# Patient Record
Sex: Female | Born: 1995 | Race: Black or African American | Hispanic: No | Marital: Single | Smoking: Former smoker
Health system: Southern US, Community
[De-identification: ages and names within clinical notes are randomized; demographics above are authoritative.]

## PROBLEM LIST (undated history)

## (undated) DIAGNOSIS — Z8679 Personal history of other diseases of the circulatory system: Secondary | ICD-10-CM

## (undated) DIAGNOSIS — F32A Depression, unspecified: Secondary | ICD-10-CM

## (undated) DIAGNOSIS — F329 Major depressive disorder, single episode, unspecified: Secondary | ICD-10-CM

## (undated) DIAGNOSIS — D649 Anemia, unspecified: Secondary | ICD-10-CM

## (undated) DIAGNOSIS — F419 Anxiety disorder, unspecified: Secondary | ICD-10-CM

## (undated) DIAGNOSIS — F410 Panic disorder [episodic paroxysmal anxiety] without agoraphobia: Secondary | ICD-10-CM

## (undated) DIAGNOSIS — G8929 Other chronic pain: Secondary | ICD-10-CM

## (undated) DIAGNOSIS — R0789 Other chest pain: Secondary | ICD-10-CM

## (undated) HISTORY — PX: CARDIAC ELECTROPHYSIOLOGY STUDY AND ABLATION: SHX1294

---
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
## 2014-08-15 ENCOUNTER — Emergency Department (HOSPITAL_COMMUNITY): Payer: 59

## 2014-08-15 ENCOUNTER — Encounter (HOSPITAL_COMMUNITY): Payer: Self-pay | Admitting: *Deleted

## 2014-08-15 ENCOUNTER — Emergency Department (HOSPITAL_COMMUNITY)
Admission: EM | Admit: 2014-08-15 | Discharge: 2014-08-15 | Disposition: A | Discharge status: Other Acute Inpatient Hospital | Payer: 59 | Attending: Emergency Medicine | Admitting: Emergency Medicine

## 2014-08-15 ENCOUNTER — Encounter (HOSPITAL_COMMUNITY): Payer: Self-pay

## 2014-08-15 ENCOUNTER — Inpatient Hospital Stay (HOSPITAL_COMMUNITY)
Admission: AD | Admit: 2014-08-15 | Discharge: 2014-08-17 | DRG: 885 | Disposition: A | Discharge status: Home / Self Care | Payer: 59 | Source: Intra-hospital | Attending: Psychiatry | Admitting: Psychiatry

## 2014-08-15 DIAGNOSIS — R079 Chest pain, unspecified: Secondary | ICD-10-CM | POA: Diagnosis present

## 2014-08-15 DIAGNOSIS — Z8679 Personal history of other diseases of the circulatory system: Secondary | ICD-10-CM | POA: Diagnosis not present

## 2014-08-15 DIAGNOSIS — I456 Pre-excitation syndrome: Secondary | ICD-10-CM | POA: Diagnosis present

## 2014-08-15 DIAGNOSIS — G8929 Other chronic pain: Secondary | ICD-10-CM | POA: Insufficient documentation

## 2014-08-15 DIAGNOSIS — Z3202 Encounter for pregnancy test, result negative: Secondary | ICD-10-CM | POA: Insufficient documentation

## 2014-08-15 DIAGNOSIS — R000 Tachycardia, unspecified: Secondary | ICD-10-CM | POA: Diagnosis not present

## 2014-08-15 DIAGNOSIS — F332 Major depressive disorder, recurrent severe without psychotic features: Secondary | ICD-10-CM | POA: Diagnosis present

## 2014-08-15 DIAGNOSIS — Z720 Tobacco use: Secondary | ICD-10-CM | POA: Diagnosis not present

## 2014-08-15 DIAGNOSIS — N390 Urinary tract infection, site not specified: Secondary | ICD-10-CM | POA: Diagnosis present

## 2014-08-15 DIAGNOSIS — Z793 Long term (current) use of hormonal contraceptives: Secondary | ICD-10-CM | POA: Insufficient documentation

## 2014-08-15 DIAGNOSIS — F17210 Nicotine dependence, cigarettes, uncomplicated: Secondary | ICD-10-CM | POA: Diagnosis present

## 2014-08-15 DIAGNOSIS — F410 Panic disorder [episodic paroxysmal anxiety] without agoraphobia: Secondary | ICD-10-CM | POA: Diagnosis present

## 2014-08-15 DIAGNOSIS — Z91040 Latex allergy status: Secondary | ICD-10-CM | POA: Insufficient documentation

## 2014-08-15 DIAGNOSIS — Z79899 Other long term (current) drug therapy: Secondary | ICD-10-CM | POA: Diagnosis not present

## 2014-08-15 DIAGNOSIS — Z792 Long term (current) use of antibiotics: Secondary | ICD-10-CM | POA: Diagnosis not present

## 2014-08-15 DIAGNOSIS — F419 Anxiety disorder, unspecified: Secondary | ICD-10-CM

## 2014-08-15 HISTORY — DX: Panic disorder (episodic paroxysmal anxiety): F41.0

## 2014-08-15 HISTORY — DX: Other chronic pain: G89.29

## 2014-08-15 HISTORY — DX: Other chest pain: R07.89

## 2014-08-15 HISTORY — DX: Personal history of other diseases of the circulatory system: Z86.79

## 2014-08-15 HISTORY — DX: Anxiety disorder, unspecified: F41.9

## 2014-08-15 LAB — URINE MICROSCOPIC-ADD ON

## 2014-08-15 LAB — URINALYSIS, ROUTINE W REFLEX MICROSCOPIC
Glucose, UA: NEGATIVE mg/dL
Ketones, ur: >80 mg/dL — AB
Nitrite: NEGATIVE
Protein, ur: NEGATIVE mg/dL
Specific Gravity, Urine: 1.024 (ref 1.005–1.030)
Urobilinogen, UA: 1 mg/dL (ref 0.0–1.0)
pH: 5.5 (ref 5.0–8.0)

## 2014-08-15 LAB — CBC WITH DIFFERENTIAL/PLATELET
Basophils Absolute: 0 10*3/uL (ref 0.0–0.1)
Basophils Relative: 0 % (ref 0–1)
Eosinophils Absolute: 0.1 10*3/uL (ref 0.0–0.7)
Eosinophils Relative: 1 % (ref 0–5)
HCT: 41.3 % (ref 36.0–46.0)
Hemoglobin: 14.4 g/dL (ref 12.0–15.0)
Lymphocytes Relative: 23 % (ref 12–46)
Lymphs Abs: 2 10*3/uL (ref 0.7–4.0)
MCH: 31.4 pg (ref 26.0–34.0)
MCHC: 34.9 g/dL (ref 30.0–36.0)
MCV: 90.2 fL (ref 78.0–100.0)
Monocytes Absolute: 0.4 10*3/uL (ref 0.1–1.0)
Monocytes Relative: 5 % (ref 3–12)
Neutro Abs: 6 10*3/uL (ref 1.7–7.7)
Neutrophils Relative %: 71 % (ref 43–77)
Platelets: 338 10*3/uL (ref 150–400)
RBC: 4.58 MIL/uL (ref 3.87–5.11)
RDW: 13 % (ref 11.5–15.5)
WBC: 8.4 10*3/uL (ref 4.0–10.5)

## 2014-08-15 LAB — I-STAT CHEM 8, ED
BUN: 7 mg/dL (ref 6–23)
Calcium, Ion: 1.22 mmol/L (ref 1.12–1.23)
Chloride: 105 mmol/L (ref 96–112)
Creatinine, Ser: 0.7 mg/dL (ref 0.50–1.10)
Glucose, Bld: 98 mg/dL (ref 70–99)
HCT: 39 % (ref 36.0–46.0)
Hemoglobin: 13.3 g/dL (ref 12.0–15.0)
Potassium: 3.8 mmol/L (ref 3.5–5.1)
Sodium: 140 mmol/L (ref 135–145)
TCO2: 19 mmol/L (ref 0–100)

## 2014-08-15 LAB — BASIC METABOLIC PANEL
Anion gap: 11 (ref 5–15)
BUN: 7 mg/dL (ref 6–23)
CO2: 21 mmol/L (ref 19–32)
Calcium: 10 mg/dL (ref 8.4–10.5)
Chloride: 106 mmol/L (ref 96–112)
Creatinine, Ser: 0.96 mg/dL (ref 0.50–1.10)
GFR calc Af Amer: >90 mL/min (ref >90)
GFR calc non Af Amer: 85 mL/min — ABNORMAL LOW (ref >90)
Glucose, Bld: 81 mg/dL (ref 70–99)
Potassium: 3.8 mmol/L (ref 3.5–5.1)
Sodium: 138 mmol/L (ref 135–145)

## 2014-08-15 LAB — TROPONIN I: Troponin I: <0.03 ng/mL (ref <0.031)

## 2014-08-15 LAB — PREGNANCY, URINE: Preg Test, Ur: NEGATIVE

## 2014-08-15 LAB — MAGNESIUM: Magnesium: 2.3 mg/dL (ref 1.5–2.5)

## 2014-08-15 LAB — I-STAT TROPONIN, ED: Troponin i, poc: 0 ng/mL (ref 0.00–0.08)

## 2014-08-15 MED ORDER — METRONIDAZOLE 500 MG PO TABS
500.0000 mg | ORAL_TABLET | Freq: Two times a day (BID) | ORAL | Status: DC
  Filled 2014-08-15: qty 1

## 2014-08-15 MED ORDER — HYDROXYZINE HCL 25 MG PO TABS
25.0000 mg | ORAL_TABLET | Freq: Four times a day (QID) | ORAL | Status: DC | PRN
  Administered 2014-08-16: 25 mg via ORAL
  Filled 2014-08-15: qty 8
  Filled 2014-08-15: qty 1

## 2014-08-15 MED ORDER — LORAZEPAM 0.5 MG PO TABS
0.5000 mg | ORAL_TABLET | Freq: Once | ORAL | Status: AC
  Administered 2014-08-15: 0.5 mg via ORAL
  Filled 2014-08-15: qty 1

## 2014-08-15 MED ORDER — ALUM & MAG HYDROXIDE-SIMETH 200-200-20 MG/5ML PO SUSP: 30.0000 mL | Freq: Four times a day (QID) | ORAL | Status: DC | PRN

## 2014-08-15 MED ORDER — IBUPROFEN 600 MG PO TABS
600.0000 mg | ORAL_TABLET | Freq: Four times a day (QID) | ORAL | Status: DC | PRN
  Administered 2014-08-16 – 2014-08-17 (×2): 600 mg via ORAL
  Filled 2014-08-15 (×2): qty 1

## 2014-08-15 MED ORDER — CEPHALEXIN 500 MG PO CAPS
500.0000 mg | ORAL_CAPSULE | Freq: Four times a day (QID) | ORAL | Status: DC
  Administered 2014-08-15 (×2): 500 mg via ORAL
  Filled 2014-08-15 (×2): qty 1

## 2014-08-15 MED ORDER — TRAZODONE HCL 50 MG PO TABS
50.0000 mg | ORAL_TABLET | Freq: Every evening | ORAL | Status: DC | PRN
  Administered 2014-08-16 (×2): 50 mg via ORAL
  Filled 2014-08-15 (×2): qty 1
  Filled 2014-08-15: qty 8
  Filled 2014-08-15 (×2): qty 1
  Filled 2014-08-15: qty 8
  Filled 2014-08-15 (×2): qty 1

## 2014-08-15 NOTE — ED Notes (Signed)
Pt denying suicidal ideations at this time.

## 2014-08-15 NOTE — BHH Counselor (Signed)
[facility] by Berneice Heinrichina Tate, AC. Notified [facility]N at [employer] [facility].   Kateri PlummerKristin Linsey Hirota, M.S., LPCA, [facility] Licensed Professional Counselor Associate  Triage Specialist  [facility]  Therapeutic Triage Services Phone: [phone] Fax: [phone]

## 2014-08-15 NOTE — BH Assessment (Signed)
TTS Belenda CruiseKristin assessed the patient.

## 2014-08-15 NOTE — BH Assessment (Signed)
BHH Assessment Progress Note  Per Alyssa Headonrad Withrow, NP, pt requires psychiatric hospitalization at this time. Alyssa Heinrichina Tate, RN, Alyssa Munoz has assigned pt to Rm 507-2, but [facility] until after 20:00. Pt has reluctantly signed Voluntary Admission and Consent for Treatment, as well as Consent to Release Information. Signed documents [facility]. Pt's nurse has been notified. Alyssa Munoz to send original documents to [facility] along with pt via Alyssa Munoz, and to call report to [phone].  Alyssa Canninghomas Kristina Bertone, MA Triage Specialist 08/15/2014 @ 16:55

## 2014-08-15 NOTE — ED Notes (Signed)
Patient is in burgundy scrubs and yellow socks.  Patient mother took her cell phone and personal belongings.  Patient has been wanded by security.

## 2014-08-15 NOTE — ED Notes (Signed)
Pt c/o central/left chest aching and palpitations and SOB starting this morning.  Pain score 7/10.   Pt reports intermittently having same chest pain and told by Mayo AoFlemming MD, with Cardiology, that they could not find anything wrong.  Pt's mother reports that typically she does not have SOB w/ pain.    Hx of ablation in 2011.

## 2014-08-15 NOTE — ED Notes (Signed)
Called lab to see about the wait on the UA, they stated they were running behind on their labs and to give it another 5-10 minutes.
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
## 2014-08-15 NOTE — Progress Notes (Signed)
Pt transferred via [facility]. -SI/HI, -A/[place] Denies pain or physical discomfort. Pt calm and cooperative.

## 2014-08-15 NOTE — Progress Notes (Signed)
Pt alert, anxious but cooperative. Mother at bedside. Denies SI/HI @ present, verbally contracts for safety. -A/[place]. VSS, Denies pain or physical discomfort. [facility]. [facility] notified to transport.

## 2014-08-15 NOTE — ED Notes (Signed)
[facility]. Pt aware of POC. [facility]

## 2014-08-15 NOTE — ED Notes (Signed)
TTS at bedside to discuss poc. Pt sign consent to discuss care with mother.

## 2014-08-15 NOTE — ED Provider Notes (Signed)
CSN: [id number]     Arrival date & time 08/15/14  1140 History   First MD Initiated Contact with Patient 08/15/14 1221     Chief Complaint  Patient presents with  . Chest Pain  . Shortness of Breath      HPI Pt was seen at 1220. Per pt and her mother, c/o gradual onset and persistence of constant anxiety for the past 2 days. Pt's symptoms include: palpitations, SOB, and acute flair of her chronic chest "pain." Pt's mother states she has hx of similar symptoms, was evaluated by her Cards MD and was told "they couldn't find anything wrong." Pt's mother states pt has been "under a great deal of stress" for the past 2 days (ie: school, boyfriend), and has been "unable to sleep because she's so stressed." Denies SI, no SA, no HI, no abd pain, no N/V/D, no cough, no fevers, no back pain.     Past Medical History  Diagnosis Date  . Anxiety   . Panic attack   . History of Wolff-Parkinson-White (WPW) syndrome     s/p ablation 2011 [facility] (Peds Cards Dr. Cristy FolksGregory Fleming)  . Chronic chest wall pain 2013   Past Surgical History  Procedure Laterality Date  . Cardiac electrophysiology study and ablation      wolffe parkinson syndrome    History  Substance Use Topics  . Smoking status: Current Every Day Smoker -- 0.25 packs/day    Types: Cigarettes  . Smokeless tobacco: Not on file  . Alcohol Use: No    Review of Systems ROS: Statement: All systems negative except as marked or noted in the HPI; Constitutional: Negative for fever and chills. ; ; Eyes: Negative for eye pain, redness and discharge. ; ; ENMT: Negative for ear pain, hoarseness, nasal congestion, sinus pressure and sore throat. ; ; Cardiovascular: Negative for diaphoresis, and peripheral edema. ; ; Respiratory: Negative for cough, wheezing and stridor. ; ; Gastrointestinal: Negative for nausea, vomiting, diarrhea, abdominal pain, blood in stool, hematemesis, jaundice and rectal bleeding. . ; ; Genitourinary: Negative for dysuria, flank  pain and hematuria. ; ; Musculoskeletal: Negative for back pain and neck pain. Negative for swelling and trauma.; ; Skin: Negative for pruritus, rash, abrasions, blisters, bruising and skin lesion.; ; Neuro: Negative for headache, lightheadedness and neck stiffness. Negative for weakness, altered level of consciousness , altered mental status, extremity weakness, paresthesias, involuntary movement, seizure and syncope.; Psych:  +anxiety: palpitations, SOB, chest pain. No SI, no SA, no HI, no hallucinations.     Allergies  Latex  Home Medications   Prior to Admission medications   Medication Sig Start Date End Date Taking? Authorizing Provider  aspirin EC 81 MG tablet Take 81 mg by mouth daily.   Yes Historical Provider, MD  LORazepam (ATIVAN) 1 MG tablet Take 1 tablet (1 mg total) by mouth 2 (two) times daily. Patient not taking: Reported on 08/15/2014 07/24/13   Izola PriceFrances C. Sanford, PA-C  Norethin Ace-Eth Estrad-FE (MINASTRIN 24 FE PO) Take 1 tablet by mouth daily.   Yes Historical Provider, MD  sulfamethoxazole-trimethoprim (BACTRIM DS) 800-160 MG per tablet Take 1 tablet by mouth 2 (two) times daily. Patient not taking: Reported on 08/15/2014 09/25/13   Peyton Najjaravid H Hopper, MD   BP 124/76 mmHg  Pulse 68  Temp(Src) 98 F (36.7 C) (Oral)  Resp 18  SpO2 98%  LMP 08/01/2014 (Approximate) Physical Exam  1225: Physical examination:  Nursing notes reviewed; Vital signs and O2 SAT reviewed;  Constitutional: Well  developed, Well nourished, Well hydrated, Crying and moaning.; Head:  Normocephalic, atraumatic; Eyes: EOMI, PERRL, No scleral icterus; ENMT: Mouth and pharynx normal, Mucous membranes moist; Neck: Supple, Full range of motion, No lymphadenopathy; Cardiovascular: Tachycardic rate and rhythm, No gallop; Respiratory: Breath sounds clear & equal bilaterally, No wheezes.  Speaking sentences, Hyperventilating.; Chest: Nontender, Movement normal; Abdomen: Soft, Nontender, Nondistended, Normal bowel  sounds; Genitourinary: No CVA tenderness; Extremities: Pulses normal, No tenderness, No edema, No calf edema or asymmetry.; Neuro: AA&Ox3, Major CN grossly intact.  Speech clear. No gross focal motor or sensory deficits in extremities.; Skin: Color normal, Warm, Dry.; Psych:  Crying, moaning, hyperventilating, anxious.    ED Course  Procedures   1230:  Pt hyperventilating on arrival to ED, tachycardic, crying and moaning. Ativan 0.5mg  PO tab given while workup progresses. Pt's mother agreeable with this plan.  1410:  Initial PO ativan given with good effect: pt able to tol PO well without N/V, HR decreased to 60's/NSR on monitor. Pt's mother stepped out of pt's exam room. Pt was "worried" that her mother was not immediately available and began again to become agitated: crying, moaning, hyperventilating and becoming tachycardic. Will dose another PO ativan and consult TTS. Labs pending. Will also speak with pt's Cards MD at [facility] once they result.   1500:  +UTI; will tx with keflex. T/C to [facility] Dr. Isidor Holts (on call for Dr. Meredeth Ide), case discussed, including:  HPI, pertinent PM/SHx, VS/PE, dx testing, ED course and treatment:   pt's WPW accessory tract has been ablated and she has been cleared for sports/gym/etc, he agrees with ED workup and psych consult for panic attacks/anxiety.  TTS currently evaluating pt.  1545: After 2nd dose of PO ativan, pt is now calmer, cooperative, NAD, resps easy, HR 60's. Pt has been accepted to [facility].       EKG Interpretation   Date/Time:  Wednesday August 15 2014 11:46:51 EST Ventricular Rate:  104 PR Interval:  123 QRS Duration: 73 QT Interval:  318 QTC Calculation: 418 R Axis:   69 Text Interpretation:  Sinus tachycardia Right atrial enlargement Baseline  wander When compared with ECG of 07/23/2013 No significant change was found  Confirmed by [facility]  MD, Nicholos Johns [phone]) on 08/15/2014 1:23:36 PM      MDM   MDM Reviewed:  previous chart, nursing note and vitals Reviewed previous: labs and ECG Interpretation: labs, ECG and x-ray   Results for orders placed or performed during the hospital encounter of 08/15/14  Urinalysis, Routine w reflex microscopic  Result Value Ref Range   Color, Urine AMBER (A) YELLOW   APPearance CLOUDY (A) CLEAR   Specific Gravity, Urine 1.024 1.005 - 1.030   pH 5.5 5.0 - 8.0   Glucose, UA NEGATIVE NEGATIVE mg/dL   Hgb urine dipstick MODERATE (A) NEGATIVE   Bilirubin Urine MODERATE (A) NEGATIVE   Ketones, ur >80 (A) NEGATIVE mg/dL   Protein, ur NEGATIVE NEGATIVE mg/dL   Urobilinogen, UA 1.0 0.0 - 1.0 mg/dL   Nitrite NEGATIVE NEGATIVE   Leukocytes, UA MODERATE (A) NEGATIVE  Pregnancy, urine  Result Value Ref Range   Preg Test, Ur NEGATIVE NEGATIVE  Basic metabolic panel  Result Value Ref Range   Sodium 138 135 - 145 mmol/L   Potassium 3.8 3.5 - 5.1 mmol/L   Chloride 106 96 - 112 mmol/L   CO2 21 19 - 32 mmol/L   Glucose, Bld 81 70 - 99 mg/dL   BUN 7 6 - 23  mg/dL   Creatinine, Ser 1.61 0.50 - 1.10 mg/dL   Calcium 09.6 8.4 - 04.5 mg/dL   GFR calc non Af Amer 85 (L) >90 mL/min   GFR calc Af Amer >90 >90 mL/min   Anion gap 11 5 - 15  CBC with Differential  Result Value Ref Range   WBC 8.4 4.0 - 10.5 K/uL   RBC 4.58 3.87 - 5.11 MIL/uL   Hemoglobin 14.4 12.0 - 15.0 g/dL   HCT 40.9 81.1 - 91.4 %   MCV 90.2 78.0 - 100.0 fL   MCH 31.4 26.0 - 34.0 pg   MCHC 34.9 30.0 - 36.0 g/dL   RDW 78.2 95.6 - 21.3 %   Platelets 338 150 - 400 K/uL   Neutrophils Relative % 71 43 - 77 %   Neutro Abs 6.0 1.7 - 7.7 K/uL   Lymphocytes Relative 23 12 - 46 %   Lymphs Abs 2.0 0.7 - 4.0 K/uL   Monocytes Relative 5 3 - 12 %   Monocytes Absolute 0.4 0.1 - 1.0 K/uL   Eosinophils Relative 1 0 - 5 %   Eosinophils Absolute 0.1 0.0 - 0.7 K/uL   Basophils Relative 0 0 - 1 %   Basophils Absolute 0.0 0.0 - 0.1 K/uL  Troponin I  Result Value Ref Range   Troponin I <0.03 <0.031 ng/mL  Magnesium   Result Value Ref Range   Magnesium 2.3 1.5 - 2.5 mg/dL  Urine microscopic-add on  Result Value Ref Range   Squamous Epithelial / LPF FEW (A) RARE   WBC, UA 7-10 <3 WBC/hpf   RBC / HPF 3-6 <3 RBC/hpf   Bacteria, UA RARE RARE   Urine-Other MUCOUS PRESENT   I-stat Chem 8, ED  Result Value Ref Range   Sodium 140 135 - 145 mmol/L   Potassium 3.8 3.5 - 5.1 mmol/L   Chloride 105 96 - 112 mmol/L   BUN 7 6 - 23 mg/dL   Creatinine, Ser 0.86 0.50 - 1.10 mg/dL   Glucose, Bld 98 70 - 99 mg/dL   Calcium, Ion 5.78 4.69 - 1.23 mmol/L   TCO2 19 0 - 100 mmol/L   Hemoglobin 13.3 12.0 - 15.0 g/dL   HCT 62.9 52.8 - 41.3 %  I-stat troponin, ED  Result Value Ref Range   Troponin i, poc 0.00 0.00 - 0.08 ng/mL   Comment 3           Dg Chest Port 1 View 08/15/2014   CLINICAL DATA:  Acute chest pain.  EXAM: PORTABLE CHEST - 1 VIEW  COMPARISON:  July 24, 2013.  FINDINGS: The heart size and mediastinal contours are within normal limits. Both lungs are clear. No pneumothorax or pleural effusion is noted. The visualized skeletal structures are unremarkable.  IMPRESSION: No acute cardiopulmonary abnormality seen.   Electronically Signed   By: Roque Lias M.D.   On: 08/15/2014 14:06        Samuel Jester, DO 08/18/14 0145

## 2014-08-16 DIAGNOSIS — F332 Major depressive disorder, recurrent severe without psychotic features: Principal | ICD-10-CM

## 2014-08-16 MED ORDER — NORETHIN ACE-ETH ESTRAD-FE 1-20 MG-MCG(24) PO CHEW
1.0000 | CHEWABLE_TABLET | Freq: Every day | ORAL | Status: DC
  Administered 2014-08-16: 1 via ORAL

## 2014-08-16 MED ORDER — NITROFURANTOIN MONOHYD MACRO 100 MG PO CAPS
100.0000 mg | ORAL_CAPSULE | Freq: Two times a day (BID) | ORAL | Status: DC
Start: 2014-08-16 — End: 2014-08-17
  Administered 2014-08-16 – 2014-08-17 (×2): 100 mg via ORAL
  Filled 2014-08-16 (×5): qty 1
  Filled 2014-08-16 (×2): qty 8
  Filled 2014-08-16: qty 1

## 2014-08-16 MED ORDER — FLUOXETINE HCL 10 MG PO CAPS: 10.0000 mg | ORAL_CAPSULE | Freq: Every day | ORAL | Status: DC

## 2014-08-16 MED ORDER — HYDROCERIN EX CREA
TOPICAL_CREAM | Freq: Two times a day (BID) | CUTANEOUS | Status: DC
  Administered 2014-08-16 – 2014-08-17 (×2): via TOPICAL
  Filled 2014-08-16: qty 113

## 2014-08-16 MED ORDER — FLUOXETINE HCL 10 MG PO CAPS
10.0000 mg | ORAL_CAPSULE | Freq: Every day | ORAL | Status: DC
  Filled 2014-08-16: qty 1
  Filled 2014-08-16: qty 4

## 2014-08-16 NOTE — Progress Notes (Signed)
Adult Psychoeducational Group Note  Date:  08/16/2014 Time:  9:31 PM  Group Topic/Focus:  Wrap-Up Group:   The focus of this group is to help patients review their daily goal of treatment and discuss progress on daily workbooks.  Participation Level:  Active  Participation Quality:  Appropriate  Affect:  Appropriate  Cognitive:  Appropriate  Insight: Appropriate  Engagement in Group:  Engaged  Modes of Intervention:  Discussion  Additional Comments:  Pt goal was to learn how to cope with her heartbreak. Pt is currently working on this goal while in the hospital.  Casilda CarlsKELLY, Adalay Azucena H 08/16/2014, 9:31 PM

## 2014-08-16 NOTE — Plan of Care (Signed)
Problem: Alteration in mood Goal: LTG-Patient reports reduction in suicidal thoughts (Patient reports reduction in suicidal thoughts and is able to verbalize a safety plan for whenever patient is feeling suicidal)  Outcome: Progressing Pt denied SI this shift.  She verbally contracted for safety.       

## 2014-08-16 NOTE — [facility] Suicide Risk Assessment (Signed)
[facility] INPATIENT:  Family/Significant Other Suicide Prevention Education  Suicide Prevention Education:  Education Completed; Alyssa Munoz (pt's mother) [phone] has been identified by Wilmon Armsthe patient as the family member/significant other with whom the patient will be residing, and identified as the person(s) who will aid the patient in the event of a mental health crisis (suicidal ideations/suicide attempt).  With written consent from the patient, the family member/significant other has been provided the following suicide prevention education, prior to the and/or following the discharge of the patient.  The suicide prevention education provided includes the following:  Suicide risk factors  Suicide prevention and interventions  National Suicide Hotline telephone number  [facility] assessment telephone number  [facility] Emergency Assistance 911  [facility] and/or Residential Mobile Crisis Unit telephone number  Request made of family/significant other to:  Remove weapons (e.g., guns, rifles, knives), all items previously/currently identified as safety concern.    Remove drugs/medications (over-the-counter, prescriptions, illicit drugs), all items previously/currently identified as a safety concern.  The family member/significant other verbalizes understanding of the suicide prevention education information provided.  The family member/significant other agrees to remove the items of safety concern listed above.  Smart, Kore Madlock LCSWA  08/16/2014, 11:25 AM

## 2014-08-16 NOTE — H&P (Addendum)
Psychiatric Admission Assessment Adult  Patient Identification: Alyssa Munoz MRN:  [id number] Date of Evaluation:  08/16/2014 Chief Complaint:  MDD SINGLE EPISODE Principal Diagnosis: MDD (major depressive disorder), recurrent severe, without psychosis Diagnosis:   Patient Active Problem List   Diagnosis Date Noted  . MDD (major depressive disorder), recurrent severe, without psychosis [F33.2] 08/15/2014   History of Present Illness: Alyssa Munoz is a 19 yo female patient who came in with suicidal attempt.  Patient was tearful on admission interview today.  She states that she is young, impulsive and just did not think.  Her boyfriend broke up with her and she attempted to overdose on Tylenol.  She states that she threw them up right away.  She saw that her mother was quite upset and this made patient think about her actions.  She is minimizing her depression and the event that tok place.  She is asking to be discharged that she has school that she will miss and that she will be ok.  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  She has a history of Wolff-Parkinson-White Syndrome.  She has had surgery for this in the past.      Elements:  Location:  Depression, suicidal attempt.. Quality:  feeling hopeless, suicidal. Severity:  Severe. Timing:  After break up with boyfriend. Duration:  Acute. Context:  Patient states that she had never had any episodes before.  She was impulsive when took OD of Tylenol after boyfriend broke up with her". Associated Signs/Symptoms: Depression Symptoms:  depressed mood, feelings of worthlessness/guilt, hopelessness, suicidal attempt, anxiety, (Hypo) Manic Symptoms:  Labiality of Mood, Anxiety Symptoms:  Excessive Worry, Psychotic Symptoms:  NA PTSD Symptoms: Break up with BF Total Time spent with patient: 30 minutes  Past Medical History:  Past Medical History  Diagnosis Date  . Anxiety   .  Panic attack   . History of Wolff-Parkinson-White (WPW) syndrome     s/p ablation 2011 [facility] (Peds Cards Dr. Kathie Rhodes)  . Chronic chest wall pain 2013    Past Surgical History  Procedure Laterality Date  . Cardiac electrophysiology study and ablation      wolffe parkinson syndrome   Family History: History reviewed. No pertinent family history. Social History:  History  Alcohol Use No     History  Drug Use No    History   Social History  . Marital Status: Single    Spouse Name: N/A    Number of Children: N/A  . Years of Education: N/A   Social History Main Topics  . Smoking status: Current Every Day Smoker -- 0.25 packs/day    Types: Cigarettes  . Smokeless tobacco: None  . Alcohol Use: No  . Drug Use: No  . Sexual Activity: Yes    Birth Control/ Protection: Pill   Other Topics Concern  . None   Social History Narrative   ** Merged History Encounter **       Additional Social History:      1 - Frequency: occasional  Musculoskeletal: Strength & Muscle Tone: within normal limits Gait & Station: normal Patient leans: N/A  Psychiatric Specialty Exam: Physical Exam  Vitals reviewed.   Review of Systems  Constitutional: Negative.   HENT: Negative.   Eyes: Negative.   Respiratory: Negative.   Cardiovascular: Negative.   Gastrointestinal: Negative.   Genitourinary: Negative.   Musculoskeletal: Negative.   Skin: Negative.   Neurological: Negative.  Endo/Heme/Allergies: Negative.   Psychiatric/Behavioral: Positive for depression. The patient is nervous/anxious.     Blood pressure 112/83, pulse 103, temperature 98.3 F (36.8 C), temperature source Oral, resp. rate 16, height _0  (1.6 m), weight 47.174 kg (104 lb), last menstrual period 08/01/2014.Body mass index is 18.43 kg/(m^2).  General Appearance: Fairly Groomed  Engineer, water::  Good  Speech:  Clear and Coherent  Volume:  Normal  Mood:  Depressed  Affect:  Appropriate  Thought Process:   Intact  Orientation:  Full (Time, Place, and Person)  Thought Content:  Rumination  Suicidal Thoughts:  No  Homicidal Thoughts:  No  Memory:  Immediate;   Fair Recent;   Fair Remote;   Fair  Judgement:  Fair  Insight:  Fair  Psychomotor Activity:  Normal  Concentration:  Good  Recall:  Good  Fund of Knowledge:Good  Language: Good  Akathisia:  Negative  Handed:  Right  AIMS (if indicated):     Assets:  Desire for Improvement Resilience Social Support  ADL's:  Intact  Cognition: WNL  Sleep:  Number of Hours: 2   Risk to Self: Is patient at risk for suicide?: Yes What has been your use of drugs/alcohol within the last 12 months?: none identified by pt.  Risk to Others:   Prior Inpatient Therapy:   Prior Outpatient Therapy:    Alcohol Screening: 1. How often do you have a drink containing alcohol?: 2 to 4 times a month 2. How many drinks containing alcohol do you have on a typical day when you are drinking?: 1 or 2 3. How often do you have six or more drinks on one occasion?: Never Preliminary Score: 0 9. Have you or someone else been injured as a result of your drinking?: No 10. Has a relative or friend or a doctor or another health worker been concerned about your drinking or suggested you cut down?: No Alcohol Use Disorder Identification Test Final Score (AUDIT): 2  Allergies:   Allergies  Allergen Reactions  . Latex Hives and Itching   Lab Results:  Results for orders placed or performed during the hospital encounter of 08/15/14 (from the past 48 hour(s))  Basic metabolic panel     Status: Abnormal   Collection Time: 08/15/14 12:31 PM  Result Value Ref Range   Sodium 138 135 - 145 mmol/L   Potassium 3.8 3.5 - 5.1 mmol/L   Chloride 106 96 - 112 mmol/L   CO2 21 19 - 32 mmol/L   Glucose, Bld 81 70 - 99 mg/dL   BUN 7 6 - 23 mg/dL   Creatinine, Ser 0.96 0.50 - 1.10 mg/dL   Calcium 10.0 8.4 - 10.5 mg/dL   GFR calc non Af Amer 85 (L) >90 mL/min   GFR calc Af Amer  >90 >90 mL/min    Comment: (NOTE) The eGFR has been calculated using the CKD EPI equation. This calculation has not been validated in all clinical situations. eGFR's persistently <90 mL/min signify possible Chronic Kidney Disease.    Anion gap 11 5 - 15    Comment: Performed at [facility]  CBC with Differential     Status: None   Collection Time: 08/15/14 12:31 PM  Result Value Ref Range   WBC 8.4 4.0 - 10.5 K/uL   RBC 4.58 3.87 - 5.11 MIL/uL   Hemoglobin 14.4 12.0 - 15.0 g/dL   HCT 41.3 36.0 - 46.0 %   MCV 90.2 78.0 - 100.0 fL   MCH 31.4  26.0 - 34.0 pg   MCHC 34.9 30.0 - 36.0 g/dL   RDW 13.0 11.5 - 15.5 %   Platelets 338 150 - 400 K/uL   Neutrophils Relative % 71 43 - 77 %   Neutro Abs 6.0 1.7 - 7.7 K/uL   Lymphocytes Relative 23 12 - 46 %   Lymphs Abs 2.0 0.7 - 4.0 K/uL   Monocytes Relative 5 3 - 12 %   Monocytes Absolute 0.4 0.1 - 1.0 K/uL   Eosinophils Relative 1 0 - 5 %   Eosinophils Absolute 0.1 0.0 - 0.7 K/uL   Basophils Relative 0 0 - 1 %   Basophils Absolute 0.0 0.0 - 0.1 K/uL  Troponin I     Status: None   Collection Time: 08/15/14 12:31 PM  Result Value Ref Range   Troponin I <0.03 <0.031 ng/mL    Comment:        NO INDICATION OF MYOCARDIAL INJURY. Performed at [facility]   Magnesium     Status: None   Collection Time: 08/15/14 12:31 PM  Result Value Ref Range   Magnesium 2.3 1.5 - 2.5 mg/dL    Comment: Performed at [facility]  Urinalysis, Routine w reflex microscopic     Status: Abnormal   Collection Time: 08/15/14  1:15 PM  Result Value Ref Range   Color, Urine AMBER (A) YELLOW    Comment: BIOCHEMICALS MAY BE AFFECTED BY COLOR   APPearance CLOUDY (A) CLEAR   Specific Gravity, Urine 1.024 1.005 - 1.030   pH 5.5 5.0 - 8.0   Glucose, UA NEGATIVE NEGATIVE mg/dL   Hgb urine dipstick MODERATE (A) NEGATIVE   Bilirubin Urine MODERATE (A) NEGATIVE   Ketones, ur >80 (A) NEGATIVE mg/dL   Protein, ur NEGATIVE NEGATIVE mg/dL    Urobilinogen, UA 1.0 0.0 - 1.0 mg/dL   Nitrite NEGATIVE NEGATIVE   Leukocytes, UA MODERATE (A) NEGATIVE  Pregnancy, urine     Status: None   Collection Time: 08/15/14  1:15 PM  Result Value Ref Range   Preg Test, Ur NEGATIVE NEGATIVE    Comment:        THE SENSITIVITY OF THIS METHODOLOGY IS >20 mIU/mL.   Urine microscopic-add on     Status: Abnormal   Collection Time: 08/15/14  1:15 PM  Result Value Ref Range   Squamous Epithelial / LPF FEW (A) RARE   WBC, UA 7-10 <3 WBC/hpf   RBC / HPF 3-6 <3 RBC/hpf   Bacteria, UA RARE RARE   Urine-Other MUCOUS PRESENT   I-stat Chem 8, ED     Status: None   Collection Time: 08/15/14  2:24 PM  Result Value Ref Range   Sodium 140 135 - 145 mmol/L   Potassium 3.8 3.5 - 5.1 mmol/L   Chloride 105 96 - 112 mmol/L   BUN 7 6 - 23 mg/dL   Creatinine, Ser 0.70 0.50 - 1.10 mg/dL   Glucose, Bld 98 70 - 99 mg/dL   Calcium, Ion 1.22 1.12 - 1.23 mmol/L   TCO2 19 0 - 100 mmol/L   Hemoglobin 13.3 12.0 - 15.0 g/dL   HCT 39.0 36.0 - 46.0 %  I-stat troponin, ED     Status: None   Collection Time: 08/15/14  2:24 PM  Result Value Ref Range   Troponin i, poc 0.00 0.00 - 0.08 ng/mL   Comment 3            Comment: Due to the release kinetics of cTnI,  a negative result within the first hours of the onset of symptoms does not rule out myocardial infarction with certainty. If myocardial infarction is still suspected, repeat the test at appropriate intervals.    Current Medications: Current Facility-Administered Medications  Medication Dose Route Frequency Provider Last Rate Last Dose  . alum & mag hydroxide-simeth (MAALOX/MYLANTA) 200-200-20 MG/5ML suspension 30 mL  30 mL Oral Q6H PRN Laverle Hobby, PA-C      . hydrOXYzine (ATARAX/VISTARIL) tablet 25 mg  25 mg Oral Q6H PRN Laverle Hobby, PA-C      . ibuprofen (ADVIL,MOTRIN) tablet 600 mg  600 mg Oral Q6H PRN Laverle Hobby, PA-C      . traZODone (DESYREL) tablet 50 mg  50 mg Oral QHS,MR X 1 Spencer E  Simon, PA-C   50 mg at 08/16/14 4580   PTA Medications: No prescriptions prior to admission   Previous Psychotropic Medications: No   Substance Abuse History in the last 12 months:  No.  Consequences of Substance Abuse: NA  Results for orders placed or performed during the hospital encounter of 08/15/14 (from the past 72 hour(s))  Basic metabolic panel     Status: Abnormal   Collection Time: 08/15/14 12:31 PM  Result Value Ref Range   Sodium 138 135 - 145 mmol/L   Potassium 3.8 3.5 - 5.1 mmol/L   Chloride 106 96 - 112 mmol/L   CO2 21 19 - 32 mmol/L   Glucose, Bld 81 70 - 99 mg/dL   BUN 7 6 - 23 mg/dL   Creatinine, Ser 0.96 0.50 - 1.10 mg/dL   Calcium 10.0 8.4 - 10.5 mg/dL   GFR calc non Af Amer 85 (L) >90 mL/min   GFR calc Af Amer >90 >90 mL/min    Comment: (NOTE) The eGFR has been calculated using the CKD EPI equation. This calculation has not been validated in all clinical situations. eGFR's persistently <90 mL/min signify possible Chronic Kidney Disease.    Anion gap 11 5 - 15    Comment: Performed at [facility]  CBC with Differential     Status: None   Collection Time: 08/15/14 12:31 PM  Result Value Ref Range   WBC 8.4 4.0 - 10.5 K/uL   RBC 4.58 3.87 - 5.11 MIL/uL   Hemoglobin 14.4 12.0 - 15.0 g/dL   HCT 41.3 36.0 - 46.0 %   MCV 90.2 78.0 - 100.0 fL   MCH 31.4 26.0 - 34.0 pg   MCHC 34.9 30.0 - 36.0 g/dL   RDW 13.0 11.5 - 15.5 %   Platelets 338 150 - 400 K/uL   Neutrophils Relative % 71 43 - 77 %   Neutro Abs 6.0 1.7 - 7.7 K/uL   Lymphocytes Relative 23 12 - 46 %   Lymphs Abs 2.0 0.7 - 4.0 K/uL   Monocytes Relative 5 3 - 12 %   Monocytes Absolute 0.4 0.1 - 1.0 K/uL   Eosinophils Relative 1 0 - 5 %   Eosinophils Absolute 0.1 0.0 - 0.7 K/uL   Basophils Relative 0 0 - 1 %   Basophils Absolute 0.0 0.0 - 0.1 K/uL  Troponin I     Status: None   Collection Time: 08/15/14 12:31 PM  Result Value Ref Range   Troponin I <0.03 <0.031 ng/mL    Comment:         NO INDICATION OF MYOCARDIAL INJURY. Performed at [facility]   Magnesium     Status: None  Collection Time: 08/15/14 12:31 PM  Result Value Ref Range   Magnesium 2.3 1.5 - 2.5 mg/dL    Comment: Performed at [facility]  Urinalysis, Routine w reflex microscopic     Status: Abnormal   Collection Time: 08/15/14  1:15 PM  Result Value Ref Range   Color, Urine AMBER (A) YELLOW    Comment: BIOCHEMICALS MAY BE AFFECTED BY COLOR   APPearance CLOUDY (A) CLEAR   Specific Gravity, Urine 1.024 1.005 - 1.030   pH 5.5 5.0 - 8.0   Glucose, UA NEGATIVE NEGATIVE mg/dL   Hgb urine dipstick MODERATE (A) NEGATIVE   Bilirubin Urine MODERATE (A) NEGATIVE   Ketones, ur >80 (A) NEGATIVE mg/dL   Protein, ur NEGATIVE NEGATIVE mg/dL   Urobilinogen, UA 1.0 0.0 - 1.0 mg/dL   Nitrite NEGATIVE NEGATIVE   Leukocytes, UA MODERATE (A) NEGATIVE  Pregnancy, urine     Status: None   Collection Time: 08/15/14  1:15 PM  Result Value Ref Range   Preg Test, Ur NEGATIVE NEGATIVE    Comment:        THE SENSITIVITY OF THIS METHODOLOGY IS >20 mIU/mL.   Urine microscopic-add on     Status: Abnormal   Collection Time: 08/15/14  1:15 PM  Result Value Ref Range   Squamous Epithelial / LPF FEW (A) RARE   WBC, UA 7-10 <3 WBC/hpf   RBC / HPF 3-6 <3 RBC/hpf   Bacteria, UA RARE RARE   Urine-Other MUCOUS PRESENT   I-stat Chem 8, ED     Status: None   Collection Time: 08/15/14  2:24 PM  Result Value Ref Range   Sodium 140 135 - 145 mmol/L   Potassium 3.8 3.5 - 5.1 mmol/L   Chloride 105 96 - 112 mmol/L   BUN 7 6 - 23 mg/dL   Creatinine, Ser 0.70 0.50 - 1.10 mg/dL   Glucose, Bld 98 70 - 99 mg/dL   Calcium, Ion 1.22 1.12 - 1.23 mmol/L   TCO2 19 0 - 100 mmol/L   Hemoglobin 13.3 12.0 - 15.0 g/dL   HCT 39.0 36.0 - 46.0 %  I-stat troponin, ED     Status: None   Collection Time: 08/15/14  2:24 PM  Result Value Ref Range   Troponin i, poc 0.00 0.00 - 0.08 ng/mL   Comment 3            Comment: Due to  the release kinetics of cTnI, a negative result within the first hours of the onset of symptoms does not rule out myocardial infarction with certainty. If myocardial infarction is still suspected, repeat the test at appropriate intervals.     Observation Level/Precautions:  15 minute checks  Laboratory:  per ED  Psychotherapy:  Group therapy  Medications:  As per medlist  Consultations:  As needed  Discharge Concerns:  Safety  Estimated LOS:  5-7 days  Other:     Psychological Evaluations: Yes   Treatment Plan Summary: Daily contact with patient to assess and evaluate symptoms and progress in treatment and Medication management  -Started on Macrobid 100 mg BID for UTI -Started on Prozac 10 mg QD for depression -Started on Eucerin for dry spots  Medical Decision Making:  New problem, with additional work up planned, Review of Psycho-Social Stressors (1), Discuss test with performing physician (1), New Problem, with no additional work-up planned (3), Review of Medication Regimen & Side Effects (2) and Review of New Medication or Change in Dosage (2)  I certify  that inpatient services furnished can reasonably be expected to improve the patient's condition.   Kerrie Buffalo MAY, AGNP-BC 1/28/20161:49 PM   I have discussed case with NP as above and have met with patient. Agree with NP's Note, Assessment and Plan Patient is a 19 year old female, college student, denies any significant prior psychiatric history or recent depression. States she had a recent break up with her boyfriend, felt acutely upset , depressed and impulsively overdosed on several Tylenol tablets. She states she spontaneously vomited soon after. She does state she  Has been struggling with some mild depression, and states she feels she would normally be less reactive to stressors such as above. Expressed interest in an antidepressant, started on Prozac- side effects, to include potential risk of increased suicidal  ideations early in treatment in young adults reviewed.

## 2014-08-16 NOTE — Tx Team (Signed)
Interdisciplinary Treatment Plan Update (Adult)   Date: 08/16/2014   Time Reviewed: 8:51 AM  Progress in Treatment:  Attending groups: no-new to unit  Participating in groups:  No Taking medication as prescribed: Yes  Tolerating medication: Yes  Family/Significant othe contact made: SPE completed with pt's mother.   Patient understands diagnosis: Yes, AEB seeking treatment for SI with attempted overdose, depression/anxiety/panic/mood instability, impulsivity, and for medication stabilization.  Discussing patient identified problems/goals with staff: Yes  Medical problems stabilized or resolved: Yes  Denies suicidal/homicidal ideation: Passive SI/able to contract for safety.  Patient has not harmed self or Others: Yes  New problem(s) identified:  Discharge Plan or Barriers: Pt plans to return home at d/c and is requesting referral for med management and therapy. CSW assessing.  Additional comments: Alyssa Munoz is an 19 y.o. female who [facility] with complaints of chest pains, shortness of breath and panic attacks. She stated that she has had two panic attacks today and has a lot of stress with a recent break up, issues with her friends and school. She states that she tried to overdose yesterday on 6-7 Acetaminophen tablets and ended up throwing up. She states that this was an attempt to end her life but she did not tell anyone until the assessment today. She states that she "doesn't know" if she feels suicidal today but states that she gets impulsive sometimes. She says that the overdose had a lot to do with the break up but she has had other stressors as well. She states that she has never felt "this bad" before but has had some depression in the past. She says that she has felt depressed since 8 months ago when the initial break up happened. She states she does not like to feel alone. She also states she gets scared when she is alone and has a lot of anxiety when  she is by herself. Pt does not endorse HI or A/V hallucinations at this time. She does state that she smokes marijuana almost daily and smoked earlier today. She states that this helps with her anxiety and stress and helps her relax. No other substance abuse issues besides social drinking.  Reason for Continuation of Hospitalization: Panic attacks/anxiety/depression Medication stabilization Estimated length of stay: 5-7 days  For review of initial/current patient goals, please see plan of care.  Attendees:  Patient:    Family:    Physician: Geoffery LyonsIrving Lugo MD/Dr. Jama Flavorsobos MD 08/16/2014 8:47 AM   Nursing: Darcella GasmanBeverly, Diane RN 08/16/2014 8:47 AM   Clinical Social Worker Veryl Abril Smart, LCSWA  08/16/2014 8:47 AM   Other: Ellison CarwinQuylle LCSW; Kristin D. LCSWA 08/16/2014 8:47 AM   Other: Darden DatesJennifer C. Nurse CM 08/16/2014 8:47 AM   Other: Mercy RidingValerie, [facility] TCT  08/16/2014 8:47 AM   Other:    Scribe for Treatment Team:  The Sherwin-WilliamsHeather Smart LCSWA 08/16/2014 8:47 AM

## 2014-08-16 NOTE — Progress Notes (Signed)
NSG shift assessment. 7a-7p.   D: Affect blunted, mood depressed, behavior appropriate. Sleep last night was good and she did request medication for sleep.  Appetite has been good with a normal energy level. Concentration is good. On a scale of 0 to 10, with 10 being worst, pt rates depression as 0, hopelessness is 0 and anxiety is 0. Attends groups and participates. Goal is to remain humble. Cooperative with staff and is getting along well with peers.   A: Observed pt interacting in group and in the milieu: Support and encouragement offered. Safety maintained with observations every 15 minutes.   R:   Contracts for safety and continues to follow the treatment plan, working on learning new coping skills.

## 2014-08-16 NOTE — [facility] Group Notes (Signed)
[facility] LCSW Group Therapy  08/16/2014 2:20 PM  Type of Therapy:  Group Therapy  Participation Level:  None-pt pulled out of group by MD early in session. Did not return.   Participation Quality:  Attentive  Affect:  Appropriate  Cognitive:  Alert and Oriented  Insight:  Improving  Engagement in Therapy:  None-see above  Modes of Intervention:  Confrontation, Discussion, Education, Exploration, Problem-solving, Rapport Building, Socialization and Support  Summary of Progress/Problems:  Finding Balance in Life. Today's group focused on defining balance in one's own words, identifying things that can knock one off balance, and exploring healthy ways to maintain balance in life. Group members were asked to provide an example of a time when they felt off balance, describe how they handled that situation,and process healthier ways to regain balance in the future. Group members were asked to share the most important tool for maintaining balance that they learned [facility] and how they plan to apply this method after discharge.   Smart, Alyssa Munoz LCSWA  08/16/2014, 2:20 PM

## 2014-08-16 NOTE — Progress Notes (Signed)
D: Pt has depressed, sad affect and mood.  Met with pt 1:1.  Pt reported "I have issues coping with things.  There are multiple things going on in my life adding to my stress.  The guy I've been with for 3 years hurt and I feel broken.  I requested to take an antidepressant because I know I can't cope with this alone.  I've been replaying the things he said to me over and over."  Pt reported that she intentionally "took some pills" and then "threw them up."  Pt reports she had a panic attack, which is why she went to the ED.  Pt was tearful at times while talking to Probation officer.  Pt attended evening group and interacted appropriately with staff and peers.  Pt denies SI/HI, denies hallucinations.   A: Medications administered per order.  Safety maintained.  Met with pt 1:1 and provided support and encouragement.  Actively listened to pt.  PRN medication administered for anxiety, see flowsheet.   R: Pt is compliant with medications.  She verbally contracted for safety.  Pt reports she will notify staff of needs and concerns.  Will continue to monitor and assess for safety.

## 2014-08-16 NOTE — [facility] Group Notes (Signed)
0900 Nursing Group Note   The focus of this group is to educate the patient on the purpose and policies of crisis stabilization and provide a format to answer questions about their admission.  The group details unit policies and expectations of patients while admitted.  Pt was present, alert and appropriate during group.   

## 2014-08-16 NOTE — Progress Notes (Signed)
D)  [facility] and [facility], but voiced her anxiety over being here.  Was cooperative throughout the admission process, stated she has had a number of stressors that left her feeling overwhelmed and with thoughts of suicide and admitted then to taking 5 or 6 ibuprofen yesterday, and admitted that at the time she had thought about dying, and then made herself throw up.  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  Also stated she is close to her family, and requested to call her mother tonight.  When she made the call, she became tearful and wanted her mother to come tonight and get her, spoke with her mother and explained why she couldn't get her tonight but also explained visitation and program.  Was oriented to unit, rules, contracts for safety. Pt states has cardiac history of an ablation in 2011 for WPW syndrome, [facility]. A)  Will continue to monitor for safety, continue POC R)  Safety maintained

## 2014-08-16 NOTE — [facility] Counselor (Signed)
Adult Comprehensive Assessment  Patient ID: Alyssa Munoz, female   DOB: [birth year], 19 y.o.   MRN: [id number]  Information Source: Information source: Patient  Current Stressors:  Physical health (include injuries & life threatening diseases): none identified.  Bereavement / Loss: Broke up with boyfriend 8 months ago-we officially broke up Tuesday. We had been dating for 3 years.   Living/Environment/Situation:  Living Arrangements: Parent Living conditions (as described by patient or guardian): lives with mom, brother, and stepdad.  How long has patient lived in current situation?: We get along great. I've lived there for my whole life. My stepdad has been in my life since age 16 and took the roll of dad.  What is atmosphere in current home: Comfortable, Supportive, Loving  Family History:  Marital status: Single Does patient have children?: No  Childhood History:  By whom was/is the patient raised?: Mother/father and step-parent Additional childhood history information: mom and stepdad raised me. no relationship with biological dad.  Description of patient's relationship with caregiver when they were a child: great relationship with mom and stepdad.  Patient's description of current relationship with people who raised him/her: still very close to mom and stepdad. no relationship with bio father.  Does patient have siblings?: Yes Number of Siblings: 1 Description of patient's current relationship with siblings: good relationship with little brother who lives in the home.  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 I'm doing well in school.  Currently a student?: Yes If yes, how has current illness impacted academic performance: n/a  Name of school: [employer] Contact person: n/a  How long has the patient attended?: 1 year  Learning disability?: No  Employment/Work Situation:   Employment situation: Surveyor, minerals job has been impacted by current illness: No (n/a ) What is the longest time patient has a held a job?: no work history Where was the patient employed at that time?: no work history Has patient ever been in the [employer]?: No Has patient ever served in Buyer, retail?: No  Financial Resources:   Psychologist, prison and probation services, Support from parents / caregiver Does patient have a Lawyer or guardian?: No  Alcohol/Substance Abuse:   What has been your use of drugs/alcohol within the last 12 months?: none identified by pt.  If attempted suicide, did drugs/alcohol play a role in this?: Yes (I took an overdose of tylenol on Tuesday night. That was my only attempt. never had thoughts before) Alcohol/Substance Abuse Treatment Hx: Denies past history If yes, describe treatment: n/a  Has alcohol/substance abuse ever caused legal problems?: No  Social Support System:   Patient's Community Support System: Good Describe Community Support System: Mostly friends from school. Type of faith/religion: Christian-Baptist How does patient's faith help to cope with current illness?: prayer; church  Leisure/Recreation:   Leisure and Hobbies: ran track in high school. Doing hair.   Strengths/Needs:    Strengths: insightful/motivated to seek counseling outpatient and try medication if necessary. Needs: effective coping skills to manage stress/depression, med management, impulsivity/emotional regulation.   Discharge Plan:   Does patient have access to transportation?:  Yes (mom drives me) Will  patient be returning to same living situation after discharge?: Yes (returning home with mom) Currently receiving community mental health services: No If no, would patient like referral for services when discharged?: Yes (What county?) Medical sales representative([place]) Does patient have financial barriers related to discharge medications?: No (private insurance/support from mom and stepddad)  Summary/Recommendations:    Pt is 19 year old female living with her mother, brother, and stepfather in [place], [place]. [facility] to suicide attempt by overdose, depression, impulsivity, and for medication stabilization. Pt denies HI/AVH upon admission and denies SI currently. Pt denies S/A history and denies previous SI or attempts. Pt has no prior mental health history and does not have outpatient providers. Pt identifies triggers as: recent breakup with bf of three years, and stress of school-pt in beauty school currently. Recommendations for pt include: crisis stabilization, therapeutic milieu, encourage group attendance and participation, medication management for mood stabilization, and development of comprehensive mental wellness plan. Pt plans to return home with mom at d/c and is open to referral for med management and 1:1 therapy-CSW assessing for appropriate referrals.   Trula SladeSmart, Ohanna Gassert [facility]osp [facility] VistaCSWA 08/16/2014 11:32 AM

## 2014-08-17 MED ORDER — HYDROXYZINE HCL 25 MG PO TABS: 25.0000 mg | ORAL_TABLET | Freq: Four times a day (QID) | ORAL | Status: DC | PRN

## 2014-08-17 MED ORDER — TRAZODONE HCL 50 MG PO TABS: 50.0000 mg | ORAL_TABLET | Freq: Every evening | ORAL | Status: DC | PRN

## 2014-08-17 MED ORDER — NORETHIN ACE-ETH ESTRAD-FE 1-20 MG-MCG(24) PO CHEW: 1.0000 | CHEWABLE_TABLET | Freq: Every day | ORAL | Status: DC

## 2014-08-17 MED ORDER — FLUOXETINE HCL 10 MG PO CAPS: 10.0000 mg | ORAL_CAPSULE | Freq: Every day | ORAL | Status: DC

## 2014-08-17 MED ORDER — NITROFURANTOIN MONOHYD MACRO 100 MG PO CAPS: 100.0000 mg | ORAL_CAPSULE | Freq: Two times a day (BID) | ORAL | Status: DC

## 2014-08-17 NOTE — Progress Notes (Addendum)
  [facility] Discharge Plan :  Will you be returning to the same living situation after discharge:  Yes,  home with mother At discharge, do you have transportation home?: Yes,  mother coming after lunch to pick her up. Do you have the ability to pay for your medications: Yes,  Advertising copywriter[employer] (per Darden DatesJennifer C. Nurse CM)  Release of information consent forms completed and submitted to Medical Records by CSW.  Patient to Follow up at: Follow-up Information    [facility] with [facility] On 08/17/2014.   Why:  referrals sent 1/28. Call agency on Monday morning to check status of referral and get appt time/date.    Contact information:   [address] [place], [place] [address] Phone: [phone] Fax: [phone]      [facility] with [facility].   Why:  referrals sent 1/28. Call agency on Monday morning to check status of referral and get appt time/date.    Contact information:   [address] [place], [place] [address] Phone: [phone] Fax: [phone]    *Pt and her mother aware that they must call [facility] on Monday morning per request of the agency to verify she is accepted as pt and to get appt dates and times. They were also provided with additional resources for mental health follow-up if she is not accepted (in chart).   Patient denies SI/HI: Yes,  during group/self report.     Safety Planning and Suicide Prevention discussed: Yes,  SPE completed with pt's mother. SPI pamphlet provided to pt and she was encouraged to share information with support network, ask questions, and talk about any concerns relating to SPE.  Has patient been referred to the Quitline?: N/A patient is not a smoker   Smart, Lebron QuamHeather LCSWA  08/17/2014, 10:36 AM

## 2014-08-17 NOTE — Progress Notes (Signed)
Nursing discharge note:  Patient discharged home per MD order.  Patient denies SI/HI/AVH.  Patient received all personal belongings, prescriptions and medication samples.  Discharge instructions and medications reviewed and patient indicated understanding of same.  Follow up appts. Reviewed with patient.  Patient left ambulatory with her mother.

## 2014-08-17 NOTE — Discharge Summary (Signed)
Physician Discharge Summary Note  Patient:  Alyssa Munoz is an 19 y.o., female MRN:  [id number] DOB:  October 04, 1995 Patient phone:  [phone] (home)  Patient address:   [address] [place] [address],  Total Time spent with patient: 30 minutes  Date of Admission:  08/15/2014 Date of Discharge: 08/17/14  Reason for Admission:  Mood stabilization treatments  Principal Problem: MDD (major depressive disorder), recurrent severe, without psychosis Discharge Diagnoses: Patient Active Problem List   Diagnosis Date Noted  . MDD (major depressive disorder), recurrent severe, without psychosis [F33.2] 08/15/2014   Musculoskeletal: Strength & Muscle Tone: within normal limits Gait & Station: normal Patient leans: N/A  Psychiatric Specialty Exam: Physical Exam  Psychiatric: She has a normal mood and affect. Her speech is normal and behavior is normal. Judgment and thought content normal. Cognition and memory are normal.    Review of Systems  Constitutional: Negative.   HENT: Negative.   Eyes: Negative.   Respiratory: Negative.   Cardiovascular: Negative.   Gastrointestinal: Negative.   Genitourinary: Negative.   Musculoskeletal: Negative.   Skin: Negative.   Neurological: Negative.   Endo/Heme/Allergies: Negative.   Psychiatric/Behavioral: Positive for depression (Stabilized with treatment) and suicidal ideas (Stabilized with treatment). Negative for hallucinations, memory loss and substance abuse. The patient is not nervous/anxious and does not have insomnia.     Blood pressure 138/78, pulse 94, temperature 98.2 F (36.8 C), temperature source Oral, resp. rate 18, height 5' 3"  (1.6 m), weight 47.174 kg (104 lb), last menstrual period 08/01/2014.Body mass index is 18.43 kg/(m^2).  See Physician SRA     Past Medical History:  Past Medical History  Diagnosis Date  . Anxiety   . Panic attack   . History of Wolff-Parkinson-White (WPW) syndrome     s/p ablation 2011 [facility]  (Peds Cards Dr. Kathie Rhodes)  . Chronic chest wall pain 2013    Past Surgical History  Procedure Laterality Date  . Cardiac electrophysiology study and ablation      wolffe parkinson syndrome   Family History: History reviewed. No pertinent family history. Social History:  History  Alcohol Use No     History  Drug Use No    History   Social History  . Marital Status: Single    Spouse Name: N/A    Number of Children: N/A  . Years of Education: N/A   Social History Main Topics  . Smoking status: Current Every Day Smoker -- 0.25 packs/day    Types: Cigarettes  . Smokeless tobacco: None  . Alcohol Use: No  . Drug Use: No  . Sexual Activity: Yes    Birth Control/ Protection: Pill   Other Topics Concern  . None   Social History Narrative   ** Merged History Encounter **        Past Psychiatric History: Hospitalizations:  Outpatient Care:  Substance Abuse Care:  Self-Mutilation:  Suicidal Attempts:  Violent Behaviors:   Risk to Self: Is patient at risk for suicide?: Yes What has been your use of drugs/alcohol within the last 12 months?: none identified by pt.  Risk to Others:   Prior Inpatient Therapy:   Prior Outpatient Therapy:    Level of Care:  OP  Hospital Course:  Alyssa Munoz is an 19 y.o. female who came to [facility] Emergency Department with complaints of chest pains, shortness of breath and panic attacks. She stated that she has had two panic attacks today and has a lot of stress with a  recent break up, issues with her friends and school. She states that she tried to overdose yesterday on 6-7 Acetaminophen tablets and ended up throwing up. She states that this was an attempt to end her life but she did not tell anyone until the assessment today. She states that she "doesn't know" if she feels suicidal today but states that she gets impulsive sometimes. She says that the overdose had a lot to do with the break up but she has had other stressors as  well. She states that she has never felt "this bad" before but has had some depression in the past. She says that she has felt depressed since 8 months ago when the initial break up happened. She states she does not like to feel alone. She also states she gets scared when she is alone and has a lot of anxiety when she is by herself. Pt does not endorse HI or A/V hallucinations at this time. She does state that she smokes marijuana almost daily and smoked earlier today. She states that this helps with her anxiety and stress and helps her relax. No other substance abuse issues besides social drinking.          Alyssa Munoz was admitted to the adult unit where she was evaluated and her symptoms were identified. Medication management was discussed and implemented. The patient was not taking any scheduled psychiatric medications prior to her admission. Patient was started on Prozac 10 mg at bedtime for treatment of depressive symptoms.  She was encouraged to participate in unit programming. Medical problems were identified and treated appropriately. The patient was ordered a five day course of Macrobid for treatment of a urinary tract infection. Home medication was restarted as needed.  She was evaluated each day by a clinical provider to ascertain the patient's response to treatment.  Improvement was noted by the patient's report of decreasing symptoms, improved sleep and appetite, affect, medication tolerance, behavior, and participation in unit programming. The patient was asked each day to complete a self inventory noting mood, mental status, pain, new symptoms, anxiety and concerns.         She responded well to medication and being in a therapeutic and supportive environment. Positive and appropriate behavior was noted and the patient was motivated for recovery.  She worked closely with the treatment team and case manager to develop a discharge plan with appropriate goals. Coping skills, problem solving as  well as relaxation therapies were also part of the unit programming. The patient expressed feeling better able to cope with stressors such as recent breakup with boyfriend now that she is taking an antidepressant. Patient reported that her suicide attempt was very impulsive and denied any further suicidal ideation.          By the day of discharge she was in much improved condition than upon admission.  Symptoms were reported as significantly decreased or resolved completely. The patient denied SI/HI and voiced no AVH. She was motivated to continue taking medication with a goal of continued improvement in mental health. Alyssa Munoz was discharged home with a plan to follow up as noted below. The patient was provided with medication samples and prescriptions. She left [facility] in no distress with all belongings returned to her. The patient planned to return home to live with her mother.   Consults:  None  Significant Diagnostic Studies:    Discharge Vitals:   Blood pressure 138/78, pulse 94, temperature 98.2 F (36.8 C), temperature source  Oral, resp. rate 18, height 5' 3"  (1.6 m), weight 47.174 kg (104 lb), last menstrual period 08/01/2014. Body mass index is 18.43 kg/(m^2). Lab Results:   Results for orders placed or performed during the hospital encounter of 08/15/14 (from the past 72 hour(s))  Basic metabolic panel     Status: Abnormal   Collection Time: 08/15/14 12:31 PM  Result Value Ref Range   Sodium 138 135 - 145 mmol/L   Potassium 3.8 3.5 - 5.1 mmol/L   Chloride 106 96 - 112 mmol/L   CO2 21 19 - 32 mmol/L   Glucose, Bld 81 70 - 99 mg/dL   BUN 7 6 - 23 mg/dL   Creatinine, Ser 0.96 0.50 - 1.10 mg/dL   Calcium 10.0 8.4 - 10.5 mg/dL   GFR calc non Af Amer 85 (L) >90 mL/min   GFR calc Af Amer >90 >90 mL/min    Comment: (NOTE) The eGFR has been calculated using the CKD EPI equation. This calculation has not been validated in all clinical situations. eGFR's persistently <90 mL/min  signify possible Chronic Kidney Disease.    Anion gap 11 5 - 15    Comment: Performed at [facility]  CBC with Differential     Status: None   Collection Time: 08/15/14 12:31 PM  Result Value Ref Range   WBC 8.4 4.0 - 10.5 K/uL   RBC 4.58 3.87 - 5.11 MIL/uL   Hemoglobin 14.4 12.0 - 15.0 g/dL   HCT 41.3 36.0 - 46.0 %   MCV 90.2 78.0 - 100.0 fL   MCH 31.4 26.0 - 34.0 pg   MCHC 34.9 30.0 - 36.0 g/dL   RDW 13.0 11.5 - 15.5 %   Platelets 338 150 - 400 K/uL   Neutrophils Relative % 71 43 - 77 %   Neutro Abs 6.0 1.7 - 7.7 K/uL   Lymphocytes Relative 23 12 - 46 %   Lymphs Abs 2.0 0.7 - 4.0 K/uL   Monocytes Relative 5 3 - 12 %   Monocytes Absolute 0.4 0.1 - 1.0 K/uL   Eosinophils Relative 1 0 - 5 %   Eosinophils Absolute 0.1 0.0 - 0.7 K/uL   Basophils Relative 0 0 - 1 %   Basophils Absolute 0.0 0.0 - 0.1 K/uL  Troponin I     Status: None   Collection Time: 08/15/14 12:31 PM  Result Value Ref Range   Troponin I <0.03 <0.031 ng/mL    Comment:        NO INDICATION OF MYOCARDIAL INJURY. Performed at [facility]   Magnesium     Status: None   Collection Time: 08/15/14 12:31 PM  Result Value Ref Range   Magnesium 2.3 1.5 - 2.5 mg/dL    Comment: Performed at [facility]  Urinalysis, Routine w reflex microscopic     Status: Abnormal   Collection Time: 08/15/14  1:15 PM  Result Value Ref Range   Color, Urine AMBER (A) YELLOW    Comment: BIOCHEMICALS MAY BE AFFECTED BY COLOR   APPearance CLOUDY (A) CLEAR   Specific Gravity, Urine 1.024 1.005 - 1.030   pH 5.5 5.0 - 8.0   Glucose, UA NEGATIVE NEGATIVE mg/dL   Hgb urine dipstick MODERATE (A) NEGATIVE   Bilirubin Urine MODERATE (A) NEGATIVE   Ketones, ur >80 (A) NEGATIVE mg/dL   Protein, ur NEGATIVE NEGATIVE mg/dL   Urobilinogen, UA 1.0 0.0 - 1.0 mg/dL   Nitrite NEGATIVE NEGATIVE   Leukocytes, UA MODERATE (A) NEGATIVE  Pregnancy, urine     Status: None   Collection Time: 08/15/14  1:15 PM  Result Value Ref  Range   Preg Test, Ur NEGATIVE NEGATIVE    Comment:        THE SENSITIVITY OF THIS METHODOLOGY IS >20 mIU/mL.   Urine microscopic-add on     Status: Abnormal   Collection Time: 08/15/14  1:15 PM  Result Value Ref Range   Squamous Epithelial / LPF FEW (A) RARE   WBC, UA 7-10 <3 WBC/hpf   RBC / HPF 3-6 <3 RBC/hpf   Bacteria, UA RARE RARE   Urine-Other MUCOUS PRESENT   I-stat Chem 8, ED     Status: None   Collection Time: 08/15/14  2:24 PM  Result Value Ref Range   Sodium 140 135 - 145 mmol/L   Potassium 3.8 3.5 - 5.1 mmol/L   Chloride 105 96 - 112 mmol/L   BUN 7 6 - 23 mg/dL   Creatinine, Ser 0.70 0.50 - 1.10 mg/dL   Glucose, Bld 98 70 - 99 mg/dL   Calcium, Ion 1.22 1.12 - 1.23 mmol/L   TCO2 19 0 - 100 mmol/L   Hemoglobin 13.3 12.0 - 15.0 g/dL   HCT 39.0 36.0 - 46.0 %  I-stat troponin, ED     Status: None   Collection Time: 08/15/14  2:24 PM  Result Value Ref Range   Troponin i, poc 0.00 0.00 - 0.08 ng/mL   Comment 3            Comment: Due to the release kinetics of cTnI, a negative result within the first hours of the onset of symptoms does not rule out myocardial infarction with certainty. If myocardial infarction is still suspected, repeat the test at appropriate intervals.     Physical Findings: AIMS:  , ,  ,  ,    CIWA:    COWS:      See Psychiatric Specialty Exam and Suicide Risk Assessment completed by Attending Physician prior to discharge.  Discharge destination:  Home  Is patient on multiple antipsychotic therapies at discharge:  No   Has Patient had three or more failed trials of antipsychotic monotherapy by history:  No    Recommended Plan for Multiple Antipsychotic Therapies: NA      Discharge Instructions    Discharge instructions    Complete by:  As directed   Follow up with your Primary Care Provider if symptoms of urinary tract infection persist after treatment with the antibiotic macrobid for a total of five days.             Medication List    TAKE these medications      Indication   FLUoxetine 10 MG capsule  Commonly known as:  PROZAC  Take 1 capsule (10 mg total) by mouth at bedtime.   Indication:  Depression     hydrOXYzine 25 MG tablet  Commonly known as:  ATARAX/VISTARIL  Take 1 tablet (25 mg total) by mouth every 6 (six) hours as needed for anxiety.   Indication:  Anxiety Neurosis, Tension     nitrofurantoin (macrocrystal-monohydrate) 100 MG capsule  Commonly known as:  MACROBID  Take 1 capsule (100 mg total) by mouth every 12 (twelve) hours. Take every twelve hours as needed for treatment of urinary tract infection.   Indication:  Urinary Tract Infection     Norethin Ace-Eth Estrad-FE 1-20 MG-MCG(24) Chew  Chew 1 tablet by mouth at bedtime.   Indication:  Pregnancy  traZODone 50 MG tablet  Commonly known as:  DESYREL  Take 1 tablet (50 mg total) by mouth at bedtime and may repeat dose one time if needed.   Indication:  Trouble Sleeping       Follow-up Information    Follow up with [facility] On 08/17/2014.   Why:  referrals sent 1/28. Call agency on Monday morning to check status of referral and get appt time/date.    Contact information:   [address] [place], [place] [address] Phone: [phone] Fax: [phone]      Follow up with [facility].   Why:  referrals sent 1/28. Call agency on Monday morning to check status of referral and get appt time/date.    Contact information:   [place],  [address] Phone: [phone] Fax: [phone]      Follow-up recommendations:   Activity: As tolerated Diet: Regular Tests: NA Other: See below  Comments:   Take all your medications as prescribed by your mental healthcare provider.  Report any adverse effects and or reactions from your medicines to your outpatient provider promptly.  Patient is instructed and cautioned to not engage in alcohol and or  illegal drug use while on prescription medicines.  In the event of worsening symptoms, patient is instructed to call the crisis hotline, 911 and or go to the nearest ED for appropriate evaluation and treatment of symptoms.  Follow-up with your primary care provider for your other medical issues, concerns and or health care needs.   Total Discharge Time: Greater than 30 minutes  Signed: DAVIS, LAURA NP-C 08/17/2014, 7:12 PM   Patient seen, Suicide Assessment Completed.  Disposition Plan Reviewed

## 2014-08-17 NOTE — Progress Notes (Signed)
Adult Psychoeducational Group Note  Date:  08/17/2014 Time:  3:21 PM  Group Topic/Focus:  Developing a Wellness Toolbox:   The focus of this group is to help patients develop a "wellness toolbox" with skills and strategies to promote recovery upon discharge.  Participation Level:  Active  Participation Quality:  Appropriate, Attentive and Sharing  Affect:  Appropriate  Cognitive:  Alert and Appropriate  Insight: Good  Engagement in Group:  Engaged  Modes of Intervention:  Activity, Clarification, Discussion, Education and Support  Additional Comments:  Pt stated that she felt "uplifted" after having done this exercise. Pt was encouraged by staff to continue to focus on positive things when she becomes depressed or overwhelmed.  Pt appeared to understand the importance of being grateful for positive things in one's life and the benefits of Gratitude Journaling.  Pt shared her list of 20 things she is thankful for and was encouraged to do this exercise on a daily basis.  Pt's list had a variety of support, gifts and talents she is aware of, and God as the number 1 thing she is grateful for.   Gwyndolyn KaufmanGrace, Hanan Moen F 08/17/2014, 3:21 PM

## 2014-08-17 NOTE — [facility] Group Notes (Signed)
[facility] LCS[facility] Dis[facility] Planning [facility] Note   08/17/2014 9:46 AM  Participation Quality:  Appropriate   Mood/Affect:  Appropriate  Depression Rating:  6  Anxiety Rating:  8  Thoughts of Suicide:  No Will you contract for safety?   NA  Current AVH:  No  Plan for Discharge/Comments:  Pt reports that she is starting to feel better and realizes that her actions have consequences. "I'm taking this as a reality check and a learning experience. Pt aware that [facility] and that she must call Monday morning to verify acceptance to this agency and to get appts for therapy and med management. Pt provided with additional options for med management and thearpy is her first choice ([facility]) does not work out.   Transportation Means: mother  Supports: mother, friends   Counselling psychologistmart, OncologistHeather LCSWA

## 2014-08-17 NOTE — BHH Suicide Risk Assessment (Addendum)
[facility]   Demographic Factors:  19 year old single female, currently in [employer]. Lives with mother.  Total Time spent with patient: 30 minutes  Musculoskeletal: Strength & Muscle Tone: within normal limits Gait & Station: normal Patient leans: N/A  Psychiatric Specialty Exam: Physical Exam  ROS  Blood pressure 138/78, pulse 94, temperature 98.2 F (36.8 C), temperature source Oral, resp. rate 18, height 5\' 3"  (1.6 m), weight 104 lb (47.174 kg), last menstrual period 08/01/2014.Body mass index is 18.43 kg/(m^2).  General Appearance: Well Groomed  Patent attorneyye Contact::  Good  Speech:  Normal Rate  Volume:  Normal  Mood:  Euthymic  Affect:  Appropriate and Full Range  Thought Process:  Goal Directed and Linear  Orientation:  Full (Time, Place, and Person)  Thought Content:  no hallucinations, no delusions  Suicidal Thoughts:  No - no thoughts of hurting self or anyone else   Homicidal Thoughts:  No  Memory: Recent and Remote grossly intact  Judgement:  Other:  improved   Insight:  Fair  Psychomotor Activity:  Normal  Concentration:  Good  Recall:  Good  Fund of Knowledge:Good  Language: Good  Akathisia:  Negative  Handed:  Right  AIMS (if indicated):     Assets:  Communication Skills Desire for Improvement Physical Health Resilience  Sleep:  Number of Hours: 6  Cognition: WNL  ADL's:  Intact      Has this patient used any form of tobacco in the last 30 days? (Cigarettes, Smokeless Tobacco, Cigars, and/or Pipes) Yes, A prescription for an FDA-approved tobacco cessation medication was offered at discharge and the patient refused  Mental Status Per Nursing Assessment::   On Admission:  NA  Current Mental Status by Physician: At this time patient is improved compared to admission- mood is euthymic, affect is appropriate, no thought disorder, no SI , no HI, no psychotic symptoms.  Loss Factors: Relationship issues- recent break up with  boyfriend  Historical Factors: No prior history of suicidal ideations, no prior history of psychiatric admissions, no history of self cutting  Risk Reduction Factors:   Sense of responsibility to family, Positive social support and Positive coping skills or problem solving skills  Continued Clinical Symptoms:  Currently improved, mood improved, affect reactive, no thought disorder, no SI or HI, no psychotic symptoms, future oriented.  Cognitive Features That Contribute To Risk:  No gross cognitive deficits noted upon discharge. Is alert , attentive, and oriented x 3  Suicide Risk:  MILD   Principal Problem: Adjustment Disorder with Depressed Mood Discharge Diagnoses:  Patient Active Problem List   Diagnosis Date Noted  . MDD (major depressive disorder), recurrent severe, without psychosis [F33.2] 08/15/2014    Follow-up Information    [facility] On 08/17/2014.   Why:  referrals sent 1/28. Call agency on Monday morning to check status of referral and get appt time/date.    Contact information:   [address] [place], [place] [address] Phone: [phone] Fax: [phone]      [facility]   Why:  referrals sent 1/28. Call agency on Monday morning to check status of referral and get appt time/date.    Contact information:   [address] [place], [place] [address] Phone: [phone] Fax: [phone]      Plan Of Care/Follow-up recommendations:  Activity:  As tolerated Diet:  Regular Tests:  NA Other:  See below  Is patient on multiple antipsychotic therapies at discharge:  No   Has Patient had three or more failed trials of antipsychotic monotherapy by history:  No  Recommended Plan for Multiple Antipsychotic Therapies: NA  Patient is leaving unit in good spirits. Plans to return home- lives with mother. Has a cardiologist , whom she sees once or twice a year- Dr.  Mayo Ao.  Ruther Ephraim 08/17/2014, 12:13 PM

## 2014-08-21 NOTE — Progress Notes (Signed)
Patient Discharge Instructions:  After Visit Summary (AVS):   Faxed to:  08/21/14 Discharge Summary Note:   Faxed to:  08/21/14 Psychiatric Admission Assessment Note:   Faxed to:  08/21/14 Suicide Risk Assessment - Discharge Assessment:   Faxed to:  08/21/14 Faxed/Sent to the Next Level Care provider:  08/21/14 [facility]axed to [facility] @ [phone]  Jerelene ReddenSheena E Lima, 08/21/2014, 3:37 PM

## 2017-07-20 NOTE — L&D Delivery Note (Addendum)
Patient: Alyssa Munoz MRN: [id number]  GBS status: positive, IAP given penG x1 dose  Patient is a 22 y.o. now G1P1001 s/p NSVD at [redacted]w[redacted]d, who was admitted for SOL. SROM 0h 59m prior to delivery with meconium fluid.    Delivery Note At 9:58 PM a healthy female was delivered via Vaginal, Spontaneous (Presentation:ROA, Nuchal x1).  APGAR: 8, 9; weight pending .   Placenta status: spontaneous delivery, intact, small clot.  3 vessel Cord  Head delivered ROA. Nuchal cord x1 present, reduced after delivery. Shoulder and body delivered in usual fashion. Infant with spontaneous cry, placed on mother's abdomen, dried and bulb suctioned. Cord clamped x 2 after 2.5-minute delay, and cut by family member. Cord blood drawn. Placenta delivered spontaneously with gentle cord traction. Fundus firm with massage and Pitocin. Perineum inspected and found to have 1st degree and bilateral labial laceration, which was repaired with 4.0 rapide with good hemostasis achieved.  Anesthesia:  Epidural, lidocaine Episiotomy: None Lacerations: 1st degree; bil Labial Suture Repair:4.0 vicryl rapide Est. Blood Loss (mL): 300  Mom to postpartum.  Baby to Couplet care / Skin to Skin.  Denzil Hughes 03/25/2018, 10:37 PM

## 2017-09-17 ENCOUNTER — Ambulatory Visit (INDEPENDENT_AMBULATORY_CARE_PROVIDER_SITE_OTHER): Payer: Medicaid Other | Admitting: Advanced Practice Midwife

## 2017-09-17 ENCOUNTER — Other Ambulatory Visit (HOSPITAL_COMMUNITY)
Admission: RE | Admit: 2017-09-17 | Discharge: 2017-09-17 | Disposition: A | Discharge status: Home / Self Care | Payer: Medicaid Other | Source: Ambulatory Visit | Attending: Advanced Practice Midwife | Admitting: Advanced Practice Midwife

## 2017-09-17 ENCOUNTER — Encounter: Payer: Self-pay | Admitting: Advanced Practice Midwife

## 2017-09-17 DIAGNOSIS — Z3480 Encounter for supervision of other normal pregnancy, unspecified trimester: Secondary | ICD-10-CM | POA: Diagnosis not present

## 2017-09-17 DIAGNOSIS — Z9889 Other specified postprocedural states: Secondary | ICD-10-CM

## 2017-09-17 DIAGNOSIS — I456 Pre-excitation syndrome: Secondary | ICD-10-CM | POA: Insufficient documentation

## 2017-09-17 NOTE — Patient Instructions (Signed)
Safe Medications in Pregnancy   Acne: Benzoyl Peroxide Salicylic Acid  Backache/Headache: Tylenol: 2 regular strength every 4 hours OR              2 Extra strength every 6 hours  Colds/Coughs/Allergies: Benadryl (alcohol free) 25 mg every 6 hours as needed Breath right strips Claritin Cepacol throat lozenges Chloraseptic throat spray Cold-Eeze- up to three times per day Cough drops, alcohol free Flonase (by prescription only) Guaifenesin Mucinex Robitussin DM (plain only, alcohol free) Saline nasal spray/drops Sudafed (pseudoephedrine) & Actifed ** use only after [redacted] weeks gestation and if you do not have high blood pressure Tylenol Vicks Vaporub Zinc lozenges Zyrtec   Constipation: Colace Ducolax suppositories Fleet enema Glycerin suppositories Metamucil Milk of magnesia Miralax Senokot Smooth move tea  Diarrhea: Kaopectate Imodium A-D  *NO pepto Bismol  Hemorrhoids: Anusol Anusol HC Preparation H Tucks  Indigestion: Tums Maalox Mylanta Zantac  Pepcid  Insomnia: Benadryl (alcohol free) 31m every 6 hours as needed Tylenol PM Unisom, no Gelcaps  Leg Cramps: Tums MagGel  Nausea/Vomiting:  Bonine Dramamine Emetrol Ginger extract Sea bands Meclizine  Nausea medication to take during pregnancy:  Unisom (doxylamine succinate 25 mg tablets) Take one tablet daily at bedtime. If symptoms are not adequately controlled, the dose can be increased to a maximum recommended dose of two tablets daily (1/2 tablet in the morning, 1/2 tablet mid-afternoon and one at bedtime). Vitamin B6 1047mtablets. Take one tablet twice a day (up to 200 mg per day).  Skin Rashes: Aveeno products Benadryl cream or 2540mvery 6 hours as needed Calamine Lotion 1% cortisone cream  Yeast infection: Gyne-lotrimin 7 Monistat 7   **If taking multiple medications, please check labels to avoid duplicating the same active ingredients **take medication as directed on  the label ** Do not exceed 4000 mg of tylenol in 24 hours **Do not take medications that contain aspirin or ibuprofen    AREA PEDNanafalia1 [address]uite 400MartinC  [id number] - [phone] - [phone] PEDIATRICS OF GREEverettsa[address] LaneiWestchestereCelinaC [address]one - [phone] - 336Sharon[address] ParBellvilleC  27489169one - [phone] - [phone]uiSan JacintoGreOaklandC  [address]6979one - [phone] - [phone]eHomewoodC  27449201one - [phone] - [phone] [address]uite 203158gLyonsC  27230940one - [phone] - 336West [place]uiWatervilleeDonnellyC  27415945one - [phone] - [phone] City[address]yEwinguiLipscomb0 GreWestportC  27457903one - [phone] - 336Langdon[address]eTaylor CornersC  [id number] - [phone] - [phone] Endoscopy Center LLCMCenterville2Lakeridgelm[address]eNemahaC  27434356one - [phone] - [phone] FAMCumming159C. HigBelspringC  27322336one - [phone] - [phone] Memorial HospitalMILY MEDICINE AT TRISnohomishuiFairfaxC  27435670one - [phone] -  [place] [address] [place], [place]  [address] Phone - [phone]   Fax - [phone]  [facility] [address], [place], [place]  [address] Phone - [address] [place], [place]  [address] Phone - [phone]   Fax - [place] [address] [place], [place]  [address] Phone - [phone]   Fax - [phone]  [place] [address] [place], [place]  [address] Phone - [phone]   Fax - [place]. [place], [place]  [address] Phone - [phone]   Fax - [place], [place], [place]  [address] Phone - [phone]   Fax - [place] [address], [place], [place]  [address] Phone - [phone]   Fax - [phone]  DAVID RUBIN [address] [place], [place]  [address] Phone - [phone]   Fax - [place] [address] [place], [place]  [address] Phone - [phone]   Fax - [phone]  [place] [address] [place], [place]  [address] Phone - [phone]   Fax - [phone] Arnaldo Natal [address] [place], [place]  [address] Phone - [phone]   Fax - [place] [address] [place], [place]  [address] Phone - [phone]   Fax - [phone]  [place] [address] [place], [place]  [address] Phone - [phone]   Fax - [phone]  Simms MD [address] [place] [address] Phone [phone]  Fax [phone]  Childbirth Education Options: [facility] Department Classes:  Childbirth education classes can help you get ready for a positive parenting experience. You can also meet other expectant parents and get free stuff for your baby. Each class runs for five weeks on the same night and costs $45 for the mother-to-be and her support person. Medicaid covers the cost  if you are eligible. Call [phone] to register. [facility] Childbirth Education:  [phone] or [phone] or [email or web address]  Baby & Me Class: Discuss newborn & infant parenting and family adjustment issues with other new mothers in a relaxed environment. Each week brings a new speaker or baby-centered activity. We encourage new mothers to join [place] every Thursday at 11:00am. Babies birth until crawling. No registration or fee. Daddy [employer]: This course offers Dads-to-be the tools and knowledge needed to feel confident on their journey to becoming new fathers. Experienced dads, who have been trained as coaches, teach dads-to-be how to hold, comfort, diaper, swaddle and play with their infant while being able to support the new mom as well. A class for men taught by men. $25/dad Big Brother/Big Sister: Let your children share in the joy of a new brother or sister in this special class designed just for them. Class includes discussion about how families care for babies: swaddling, holding, diapering, safety as well as how they can be helpful in their new role. This class is designed for children ages 1 to 23, but any age is welcome. Please register each child individually. $5/child  Mom Talk: This mom-led group offers support and connection to mothers as they journey through the adjustments and struggles of that sometimes overwhelming  first year after the birth of a child. Tuesdays at 10:00am and Thursdays at 6:00pm. Babies welcome. No registration or fee. Breastfeeding Support Group: This group is a mother-to-mother support circle where moms have the opportunity to share their breastfeeding experiences. A Lactation Consultant is present for questions and concerns. Meets each Tuesday at 11:00am. No fee or registration. Breastfeeding Your Baby: Learn what to expect in the first days of breastfeeding your newborn.  This class will help you feel more confident with the skills needed to  begin your breastfeeding experience. Many new mothers are concerned about breastfeeding after leaving the hospital. This class will also address the most common fears and challenges about breastfeeding during the first few weeks, months and beyond. (call for fee) Comfort Techniques and Tour: This 2 hour interactive class will provide you the opportunity to learn & practice hands-on techniques that can help relieve some of the discomfort of labor and encourage your baby to rotate toward the best position for birth. You and your partner will be able to try a variety of labor positions with birth balls and rebozos as well as practice breathing, relaxation, and visualization techniques. A tour of the [facility] is included with this class. $20 per registrant and support person Childbirth Class- Weekend Option: This class is a Weekend version of our Birth & Baby series. It is designed for parents who have a difficult time fitting several weeks of classes into their schedule. It covers the care of your newborn and the basics of labor and childbirth. It also includes a [place] [facility] and lunch. The class is held two consecutive days: beginning on Friday evening from 6:30 - 8:30 p.m. and the next day, Saturday from 9 a.m. - 4 p.m. (call for fee) Doren Custard Class: Interested in a waterbirth?  This informational class will help you discover whether waterbirth is the right fit for you. Education about waterbirth itself, supplies you would need and how to assemble your support team is what you can expect from this class. Some obstetrical practices require this class in order to pursue a waterbirth. (Not all obstetrical practices offer waterbirth-check with your healthcare provider.) Register only the expectant mom, but you are encouraged to bring your partner to class! Required if planning waterbirth, no fee. Infant/Child CPR: Parents, grandparents, babysitters,  and friends learn Cardio-Pulmonary Resuscitation skills for infants and children. You will also learn how to treat both conscious and unconscious choking in infants and children. This Family & Friends program does not offer certification. Register each participant individually to ensure that enough mannequins are available. (Call for fee) Grandparent Love: Expecting a grandbaby? This class is for you! Learn about the latest infant care and safety recommendations and ways to support your own child as he or she transitions into the parenting role. Taught by Registered Nurses who are childbirth instructors, but most importantly...they are grandmothers too! $10/person. Childbirth Class- Natural Childbirth: This series of 5 weekly classes is for expectant parents who want to learn and practice natural methods of coping with the process of labor and childbirth. Relaxation, breathing, massage, visualization, role of the partner, and helpful positioning are highlighted. Participants learn how to be confident in their body's ability to give birth. This class will empower and help parents make informed decisions about their own care. Includes discussion that will help new parents transition into the immediate postpartum period. [facility] is included. We suggest taking this class  between 25-32 weeks, but it's only a recommendation. $75 per registrant and one support person or $30 Medicaid. Childbirth Class- 3 week Series: This option of 3 weekly classes helps you and your labor partner prepare for childbirth. Newborn care, labor & birth, cesarean birth, pain management, and comfort techniques are discussed and a [place] [facility] is included. The class meets at the same time, on the same day of the week for 3 consecutive weeks beginning with the starting date you choose. $60 for registrant and one support person.  Marvelous Multiples: Expecting twins,  triplets, or more? This class covers the differences in labor, birth, parenting, and breastfeeding issues that face multiples' parents. NICU tour is included. Led by a Certified Childbirth Educator who is the mother of twins. No fee. Caring for Baby: This class is for expectant and adoptive parents who want to learn and practice the most up-to-date newborn care for their babies. Focus is on birth through the first six weeks of life. Topics include feeding, bathing, diapering, crying, umbilical cord care, circumcision care and safe sleep. Parents learn to recognize symptoms of illness and when to call the pediatrician. Register only the mom-to-be and your partner or support person can plan to come with you! $10 per registrant and support person Childbirth Class- online option: This online class offers you the freedom to complete a Birth and Baby series in the comfort of your own home. The flexibility of this option allows you to review sections at your own pace, at times convenient to you and your support people. It includes additional video information, animations, quizzes, and extended activities. Get organized with helpful eClass tools, checklists, and trackers. Once you register online for the class, you will receive an email within a few days to accept the invitation and begin the class when the time is right for you. The content will be available to you for 60 days. $60 for 60 days of online access for you and your support people.  Local Doulas: Natural Baby Doulas [email or web address] Tel: [phone] [email or web address]/ [employer] [phone] [email or web address] .[email or web address] www.[email or web address].com The Labor Hassell Halim  (also do waterbirth tub rental) [phone] [email or web address] .[email or web address] https://www.[email or web address]/ Triad Birth Doula [phone] [email or web address] .[email or web address] NotebookDistributors.fi Sacred Rhythms   [phone] https://sacred-rhythms.com/ [employer] (PADA) pada.[email or web address] .[email or web address] https://www.frey.org/ [place] Birth and Baby  http://[email or web address]/ Considering Waterbirth? Guide for patients at [facility] [employer]  Why consider waterbirth?  . Gentle birth for babies . Less pain medicine used in labor . May allow for passive descent/less pushing . May reduce perineal tears  . More mobility and instinctive maternal position changes . Increased maternal relaxation . Reduced blood pressure in labor  Is waterbirth safe? What are the risks of infection, drowning or other complications?  . Infection: o Very low risk (3.7 % for tub vs 4.8% for bed) o 7 in 8000 waterbirths with documented infection o Poorly cleaned equipment most common cause o Slightly lower group B strep transmission rate  . Drowning o Maternal:  - Very low risk   - Related to seizures or fainting o Newborn:  - Very low risk. No evidence of increased risk of respiratory problems in multiple large studies - Physiological protection from breathing under water - Avoid underwater birth if there are any fetal complications - Once baby's head is out of the water, keep it out.  . Birth complication o Some reports of cord trauma, but risk decreased by bringing baby to surface gradually o No evidence of increased risk of shoulder  dystocia. Mothers can usually change positions faster in water than in a bed, possibly aiding the maneuvers to free the shoulder.   You must attend a Doren Custard class at [facility]  3rd Wednesday of every month from 7-9pm  [employer] by calling [phone] or online at [email or web address]  Bring [place] the certificate from the class to your prenatal appointment  Meet with a midwife at 36 weeks to see if you can still plan a waterbirth and to sign the consent.   Purchase or rent the following supplies:   Water  Birth Pool (Birth Pool in a Box or Jolivue for instance)  (Tubs start ~$125)  Single-use disposable tub liner designed for your brand of tub  New garden hose labeled "lead-free", "suitable for drinking water",  Electric drain pump to remove water (We recommend 792 gallon per hour or greater pump.)   Separate garden hose to remove the dirty water  Fish net  Bathing suit top (optional)  Long-handled mirror (optional)  Places to purchase or rent supplies  GotWebTools.is for tub purchases and supplies  Waterbirthsolutions.com for tub purchases and supplies  The Labor Ladies (www.thelaborladies.com) $275 for tub rental/set-up & take down/kit   [employer] (http://www.fleming.com/.htm) Information regarding doulas (labor support) who provide pool rentals  Our practice has a Birth Pool in a Box tub at the hospital that you may borrow on a first-come-first-served basis. It is your responsibility to to set up, clean and break down the tub. We cannot guarantee the availability of this tub in advance. You are responsible for bringing all accessories listed above. If you do not have all necessary supplies you cannot have a waterbirth.    Things that would prevent you from having a waterbirth:  Premature, <37wks  Previous cesarean birth  Presence of thick meconium-stained fluid  Multiple gestation (Twins, triplets, etc.)  Uncontrolled diabetes or gestational diabetes requiring medication  Hypertension requiring medication or diagnosis of pre-eclampsia  Heavy vaginal bleeding  Non-reassuring fetal heart rate  Active infection (MRSA, etc.). Group B Strep is NOT a contraindication for  waterbirth.  If your labor has to be induced and induction method requires continuous  monitoring of the baby's heart rate  Other risks/issues identified by your obstetrical provider  Please remember that birth is unpredictable. Under certain unforeseeable  circumstances your provider may advise against giving birth in the tub. These decisions will be made on a case-by-case basis and with the safety of you and your baby as our highest priority.

## 2017-09-17 NOTE — Progress Notes (Signed)
  Subjective:    Alyssa Munoz is being seen today for her first obstetrical visit.  This is not a planned pregnancy. She is at 7241w6d gestation. Her obstetrical history is significant for none. Relationship with FOB: significant other, living together. Patient does intend to breast feed. Pregnancy history fully reviewed.  Patient reports no complaints.  Review of Systems:   Review of Systems  All other systems reviewed and are negative.   Objective:     BP 121/73   Pulse 63   Wt 113 lb 3.2 oz (51.3 kg)   LMP 06/19/2017   BMI 20.05 kg/m  Physical Exam  Nursing note and vitals reviewed. Constitutional: She is oriented to person, place, and time. She appears well-developed and well-nourished. No distress.  Cardiovascular: Normal rate.  Respiratory: Effort normal.  GI: Soft. There is no tenderness.  Genitourinary:  Genitourinary Comments:  External: no lesion Vagina: small amount of white discharge Cervix: pink, smooth, no CMT Uterus: AGA Adnexa: NT   Neurological: She is alert and oriented to person, place, and time.  Skin: Skin is warm and dry.  Psychiatric: She has a normal mood and affect.    Maternal Exam:  Abdomen: Fundal height is 12.       Fetal Exam Fetal Monitor Review: Mode: hand-held doppler probe.   Baseline rate: 158.      Assessment:    Pregnancy: G1P0 Patient Active Problem List   Diagnosis Date Noted  . Wolff-Parkinson-White (WPW) syndrome 09/17/2017  . Supervision of other normal pregnancy, antepartum 09/17/2017  . 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 H/O cardiac radiofrequency ablation 09/09/2012       Plan:     Initial labs drawn. Panorama, SMA, CF, hgb elec today Pap today  Prenatal vitamins. Problem list reviewed and updated. AFP discussed: undecided, consider AFP at next visit if desired.  Role of ultrasound in pregnancy discussed; fetal survey: requested. Amniocentesis  discussed: not indicated. Follow up in 4 weeks. 50% of 45 min visit spent on counseling and coordination of care.     Thressa ShellerHeather Hogan 09/17/2017

## 2017-09-17 NOTE — Progress Notes (Signed)
Patient is in the office for initial ob visit, unplanned pregnancy, fob living together. Pt states no complaints today.

## 2017-09-20 LAB — CYTOLOGY - PAP: Diagnosis: NEGATIVE

## 2017-09-20 LAB — CERVICOVAGINAL ANCILLARY ONLY
Chlamydia: NEGATIVE
Neisseria Gonorrhea: NEGATIVE
Trichomonas: NEGATIVE

## 2017-09-20 LAB — URINE CULTURE, OB REFLEX: Organism ID, Bacteria: NO GROWTH

## 2017-09-20 LAB — CULTURE, OB URINE

## 2017-09-25 ENCOUNTER — Other Ambulatory Visit: Payer: Self-pay | Admitting: Advanced Practice Midwife

## 2017-09-25 DIAGNOSIS — Z3480 Encounter for supervision of other normal pregnancy, unspecified trimester: Secondary | ICD-10-CM

## 2017-09-25 LAB — CYSTIC FIBROSIS MUTATION 97: Interpretation: NOT DETECTED

## 2017-09-27 ENCOUNTER — Other Ambulatory Visit: Payer: Self-pay | Admitting: Advanced Practice Midwife

## 2017-09-27 ENCOUNTER — Encounter: Payer: Self-pay | Admitting: *Deleted

## 2017-09-27 ENCOUNTER — Other Ambulatory Visit: Payer: Self-pay | Admitting: Certified Nurse Midwife

## 2017-09-27 DIAGNOSIS — Z3480 Encounter for supervision of other normal pregnancy, unspecified trimester: Secondary | ICD-10-CM

## 2017-09-27 DIAGNOSIS — O289 Unspecified abnormal findings on antenatal screening of mother: Secondary | ICD-10-CM | POA: Insufficient documentation

## 2017-09-28 LAB — OBSTETRIC PANEL, INCLUDING HIV
Antibody Screen: NEGATIVE
Basophils Absolute: 0 10*3/uL (ref 0.0–0.2)
Basos: 0 %
EOS (ABSOLUTE): 0.2 10*3/uL (ref 0.0–0.4)
Eos: 2 %
HIV Screen 4th Generation wRfx: NONREACTIVE
Hematocrit: 31.2 % — ABNORMAL LOW (ref 34.0–46.6)
Hemoglobin: 10.9 g/dL — ABNORMAL LOW (ref 11.1–15.9)
Hepatitis B Surface Ag: NEGATIVE
Immature Grans (Abs): 0 10*3/uL (ref 0.0–0.1)
Immature Granulocytes: 0 %
Lymphocytes Absolute: 2.3 10*3/uL (ref 0.7–3.1)
Lymphs: 24 %
MCH: 31.3 pg (ref 26.6–33.0)
MCHC: 34.9 g/dL (ref 31.5–35.7)
MCV: 90 fL (ref 79–97)
Monocytes Absolute: 0.7 10*3/uL (ref 0.1–0.9)
Monocytes: 7 %
Neutrophils Absolute: 6.4 10*3/uL (ref 1.4–7.0)
Neutrophils: 67 %
Platelets: 310 10*3/uL (ref 150–379)
RBC: 3.48 x10E6/uL — ABNORMAL LOW (ref 3.77–5.28)
RDW: 13.8 % (ref 12.3–15.4)
RPR Ser Ql: NONREACTIVE
Rh Factor: POSITIVE
Rubella Antibodies, IGG: 3.74 index (ref >0.99)
WBC: 9.6 10*3/uL (ref 3.4–10.8)

## 2017-09-28 LAB — SMN1 COPY NUMBER ANALYSIS (SMA CARRIER SCREENING)

## 2017-09-28 LAB — HEMOGLOBINOPATHY EVALUATION
HGB C: 0 %
HGB S: 0 %
HGB VARIANT: 0 %
Hemoglobin A2 Quantitation: 1.9 % (ref 1.8–3.2)
Hemoglobin F Quantitation: 0 % (ref 0.0–2.0)
Hgb A: 98.1 % (ref 96.4–98.8)

## 2017-09-28 NOTE — Addendum Note (Signed)
Addended by: Reva BoresPRATT, Amayia Ciano S on: 09/28/2017 09:07 AM   Modules accepted: Kipp BroodSmartSet

## 2017-09-29 ENCOUNTER — Other Ambulatory Visit: Payer: Self-pay | Admitting: Advanced Practice Midwife

## 2017-10-04 ENCOUNTER — Ambulatory Visit (HOSPITAL_COMMUNITY)
Admission: RE | Admit: 2017-10-04 | Discharge: 2017-10-04 | Disposition: A | Discharge status: Home / Self Care | Payer: Medicaid Other | Source: Ambulatory Visit | Attending: Certified Nurse Midwife | Admitting: Certified Nurse Midwife

## 2017-10-04 DIAGNOSIS — O289 Unspecified abnormal findings on antenatal screening of mother: Secondary | ICD-10-CM

## 2017-10-04 DIAGNOSIS — Z3A15 15 weeks gestation of pregnancy: Secondary | ICD-10-CM | POA: Insufficient documentation

## 2017-10-04 NOTE — Progress Notes (Addendum)
Genetic Counseling  High-Risk Gestation Note  Appointment Date:  10/04/2017 Referred By: Roe Coombs, CNM Date of Birth:  1995/08/14   Pregnancy History: G1P0 Estimated Date of Delivery: 03/26/18 Estimated Gestational Age: [redacted]w[redacted]d Attending: Eulis Foster, MD  Ms. [name] and her partner were seen for genetic counseling because of an increased risk for fetal Klinefelter syndrome based on noninvasive prenatal screening (NIPS)/cfDNA (cell free DNA) test results through her OB office. The patient's mother was also present for today's visit.   In Summary:  NIPS (Panorama) reported high risk XXY (Klinefelter)  Positive predictive value approximately 30%  Discussed diagnostic testing options  Amniocentesis- declined  Postnatal chromosome analysis for baby  Detailed ultrasound available in second trimester- informative regarding fetal sex but not regarding risk assessment for Klinefelter syndrome  Both family histories reviewed   Ms. Abair had NIPS performed through her OB provider. We reviewed that these results were consistent with a female fetus and were positive for a pattern suggestive of XXY. Specifically, Ms. Lowdermilk had [place] through [facility]  We discussed the methodology of NIPS, the sensitivity and specificity of this screen, and the false positive rate.  We reviewed that this testing is highly sensitive and specific, but is not considered diagnostic. We also reviewed that the positive predictive value (the chance that a positive result is a true positive result) is not the same as the detection rate. [facility] reports an overall positive predictive value of 89% for sex chromosome aneuploidy, but we discussed that given the patient's a priori risk based on age, the positive predictive value for the current pregnancy may be closer to 30%. We reviewed that the prenatal cell free DNA test can not distinguish between aneuploidy confined to the placenta,  fetal XXY, or mosaicism (fetal or placental), or underlying maternal aneuploidy.   Considering the possibility of Klinefelter syndrome, they were counseled in detail regarding this diagnosis. We reviewed chromosomes, nondisjunction, and that chromosome division errors happen by chance and are not usually inherited. The first 22 chromosome pairs are the autosomes and typically are the same in males and females. The 23rd pair is referred to as the sex chromosomes. Typically females have two X chromosomes, and males typically have one X and one Y chromosome. Klinefelter syndrome occurs as the result of an additional X chromosome in males, denoted as 51, XXY.   They were counseled that Klinefelter syndrome is the most common sex chromosome condition, with a prevalence of approximately 1 in 600 males. Klinefelter syndrome is most commonly ascertained either by prenatal diagnosis, or during a work-up for delayed puberty or infertility. We discussed that the features of Klinefelter are variable and are age-dependent. Newborns with Klinefelter syndrome have relatively few, if any distinguishing characteristics, although the risk of hypotonia is increased and may be noticeable shortly after birth. Characteristics in childhood and into adulthood may include tall stature, testicular failure, azoospermia, and lack of pubertal virilization. Individuals with 47,XXY typically have a unique cognitive and behavioral profile, which may include an increased risk of learning disabilities (dyslexia and attention-deficit hyperactivity disorder), differences in language development, and social/emotional problems. We discussed that early intervention services can be effective in reducing many of these issues. Additionally, testosterone replacement therapy has also been shown to be effective in alleviating certain behavioral and physical (pubertal) concerns. Literature reports that infertility is almost always inevitable for males with  47,XXY, outside of medical intervention, due to azoospermia or oligospermia. Many males with Klinefelter syndrome have gone on  to have successful pregnancies, but in most cases these pregnancies have required assisted reproductive therapies. We provided the couple with patient friendly written resources regarding 7147, XXY.   We discussed the availability of amniocentesis (after [redacted] weeks gestation) for chromosome analysis in the pregnancy. We discussed the risks, benefits, and limitations of these tests including the associated 1 in 300-500 risk for complications from amniocentesis, including spontaneous pregnancy loss. They understand that this testing would not diagnose or rule out all genetic conditions or birth defects. Additionally, we discussed the option of postnatal chromosome analysis either by sampling of the cord blood or peripheral blood to confirm or rule out the NIPS result. We discussed that detailed ultrasound would likely not be helpful in adjusting the risk of Klinefelter syndrome in the pregnancy, given that fetuses with Klinefelter's syndrome typically do not have major anatomic differences or markers for aneuploidy. While less suspected with the methodology employed by [facility], we discussed the option of maternal karyotype to assess for underlying maternal aneuploidy. [name] declined peripheral blood chromosome analysis for herself at this time.  After careful consideration, [name] declined amniocentesis at this time. The couple indicated that they would likely prefer postnatal chromosome analysis for the baby. Detailed ultrasound has not yet been scheduled at this time. They understand that screening tests cannot rule out all birth defects or genetic syndromes. The patient was advised of this limitation and states she still does not want additional testing at this time.   [name] was provided with written information regarding cystic fibrosis (CF), spinal muscular  atrophy (SMA) and hemoglobinopathies including the carrier frequency, availability of carrier screening and prenatal diagnosis if indicated.  In addition, we discussed that CF and hemoglobinopathies are routinely screened for as part of the [place] newborn screening panel.  These studies were previously performed through her OB provider and were within normal limits.   Both family histories were reviewed and found to be contributory for a female maternal first cousin once removed to the patient with albinism. He is currently early 4750's and was describe to only have skin and hair symptoms. He was not described to have vision or hearing differences. We discussed that there are various forms of albinism and that autosomal recessive inheritance is the most common form of inheritance. Given the reported family history, recurrence risk for the current pregnancy is likely low. However, additional information is needed to accurately assess recurrence risk for relatives. The family histories were otherwise noncontributory for birth defects, intellectual disability, and known genetic conditions. African American ancestry was reported for both individuals, and consanguinity was denied. Without further information regarding the provided family history, an accurate genetic risk cannot be calculated. Further genetic counseling is warranted if more information is obtained.  Ms.Schroepfer denied exposure to environmental toxins or chemical agents. She denied the use of alcohol, tobacco or street drugs. She denied significant viral illnesses during the course of her pregnancy.   I counseled this couple regarding the above risks and available options.  The approximate face-to-face time with the genetic counselor was 40 minutes.  Quinn PlowmanKaren Adriahna Shearman, MS,  Certified Genetic Counselor 10/04/2017

## 2017-10-05 NOTE — Addendum Note (Signed)
Encounter addended by: Augustin Coupeorneliussen, Dardan Shelton Louise Ech on: 10/05/2017 4:15 PM  Actions taken: Sign clinical note

## 2017-10-15 ENCOUNTER — Encounter: Payer: Self-pay | Admitting: Advanced Practice Midwife

## 2017-10-15 ENCOUNTER — Ambulatory Visit (INDEPENDENT_AMBULATORY_CARE_PROVIDER_SITE_OTHER): Payer: 59 | Admitting: Advanced Practice Midwife

## 2017-10-15 VITALS — BP 99/61 | HR 84 | Wt 112.0 lb

## 2017-10-15 DIAGNOSIS — Z363 Encounter for antenatal screening for malformations: Secondary | ICD-10-CM

## 2017-10-15 DIAGNOSIS — O289 Unspecified abnormal findings on antenatal screening of mother: Secondary | ICD-10-CM

## 2017-10-15 DIAGNOSIS — Z3480 Encounter for supervision of other normal pregnancy, unspecified trimester: Secondary | ICD-10-CM

## 2017-10-15 DIAGNOSIS — Z3A19 19 weeks gestation of pregnancy: Secondary | ICD-10-CM

## 2017-10-15 MED ORDER — CONCEPT OB 130-92.4-1 MG PO CAPS: 1.0000 | ORAL_CAPSULE | Freq: Every day | ORAL | 12 refills | Status: DC

## 2017-10-15 NOTE — Progress Notes (Signed)
   PRENATAL VISIT NOTE  Subjective:  Alyssa Munoz is a 22 y.o. G1P0 at [redacted]w[redacted]d being seen today for ongoing prenatal care.  She is currently monitored for the following issues for this high-risk pregnancy and has MDD (major depressive disorder), recurrent severe, without psychosis (HCC); Wolff-Parkinson-White (WPW) syndrome; H/O cardiac radiofrequency ablation; Supervision of other normal pregnancy, antepartum; Abnormal antenatal test; and [redacted] weeks gestation of pregnancy on their problem list.  Patient reports no complaints.  Contractions: Not present. Vag. Bleeding: None.  Movement: Absent. Denies leaking of fluid.   The following portions of the patient's history were reviewed and updated as appropriate: allergies, current medications, past family history, past medical history, past social history, past surgical history and problem list. Problem list updated.  Objective:   Vitals:   10/15/17 0924  BP: 99/61  Pulse: 84  Weight: 112 lb (50.8 kg)    Fetal Status: Fetal Heart Rate (bpm): 154   Movement: Absent     General:  Alert, oriented and cooperative. Patient is in no acute distress.  Skin: Skin is warm and dry. No rash noted.   Cardiovascular: Normal heart rate noted  Respiratory: Normal respiratory effort, no problems with respiration noted  Abdomen: Soft, gravid, appropriate for gestational age.  Pain/Pressure: Absent     Pelvic: Cervical exam deferred        Extremities: Normal range of motion.  Edema: None  Mental Status:  Normal mood and affect. Normal behavior. Normal judgment and thought content.   Assessment and Plan:  Pregnancy: G1P0 at [redacted]w[redacted]d  1. Abnormal antenatal test  - US MFM OB DETAIL +14 WK; Future  2. Supervision of other normal pregnancy, antepartum  - US MFM OB DETAIL +14 WK; Future - Declined AFP  3. [redacted] weeks gestation of pregnancy  - US MFM OB DETAIL +14 WK; Future  4. Encounter for antenatal screening for malformation  - US MFM OB DETAIL +14  WK; Future  Preterm labor symptoms and general obstetric precautions including but not limited to vaginal bleeding, contractions, leaking of fluid and fetal movement were reviewed in detail with the patient. Please refer to After Visit Summary for other counseling recommendations.  F/U 4 weeks   Dorathy KinsmanVirginia Keymiah Lyles, [place]

## 2017-10-15 NOTE — Patient Instructions (Signed)
Second Trimester of Pregnancy The second trimester is from week 13 through week 28, month 4 through 6. This is often the time in pregnancy that you feel your best. Often times, morning sickness has lessened or quit. You may have more energy, and you may get hungry more often. Your unborn baby (fetus) is growing rapidly. At the end of the sixth month, he or she is about 9 inches long and weighs about 1 pounds. You will likely feel the baby move (quickening) between 18 and 20 weeks of pregnancy. Follow these instructions at home:  Avoid all smoking, herbs, and alcohol. Avoid drugs not approved by your doctor.  Do not use any tobacco products, including cigarettes, chewing tobacco, and electronic cigarettes. If you need help quitting, ask your doctor. You may get counseling or other support to help you quit.  Only take medicine as told by your doctor. Some medicines are safe and some are not during pregnancy.  Exercise only as told by your doctor. Stop exercising if you start having cramps.  Eat regular, healthy meals.  Wear a good support bra if your breasts are tender.  Do not use hot tubs, steam rooms, or saunas.  Wear your seat belt when driving.  Avoid raw meat, uncooked cheese, and liter boxes and soil used by cats.  Take your prenatal vitamins.  Take 1500-2000 milligrams of calcium daily starting at the 20th week of pregnancy until you deliver your baby.  Try taking medicine that helps you poop (stool softener) as needed, and if your doctor approves. Eat more fiber by eating fresh fruit, vegetables, and whole grains. Drink enough fluids to keep your pee (urine) clear or pale yellow.  Take warm water baths (sitz baths) to soothe pain or discomfort caused by hemorrhoids. Use hemorrhoid cream if your doctor approves.  If you have puffy, bulging veins (varicose veins), wear support hose. Raise (elevate) your feet for 15 minutes, 3-4 times a day. Limit salt in your diet.  Avoid heavy  lifting, wear low heals, and sit up straight.  Rest with your legs raised if you have leg cramps or low back pain.  Visit your dentist if you have not gone during your pregnancy. Use a soft toothbrush to brush your teeth. Be gentle when you floss.  You can have sex (intercourse) unless your doctor tells you not to.  Go to your doctor visits. Get help if:  You feel dizzy.  You have mild cramps or pressure in your lower belly (abdomen).  You have a nagging pain in your belly area.  You continue to feel sick to your stomach (nauseous), throw up (vomit), or have watery poop (diarrhea).  You have bad smelling fluid coming from your vagina.  You have pain with peeing (urination). Get help right away if:  You have a fever.  You are leaking fluid from your vagina.  You have spotting or bleeding from your vagina.  You have severe belly cramping or pain.  You lose or gain weight rapidly.  You have trouble catching your breath and have chest pain.  You notice sudden or extreme puffiness (swelling) of your face, hands, ankles, feet, or legs.  You have not felt the baby move in over an hour.  You have severe headaches that do not go away with medicine.  You have vision changes. This information is not intended to replace advice given to you by your health care provider. Make sure you discuss any questions you have with your health care   provider. Document Released: 09/30/2009 Document Revised: 12/12/2015 Document Reviewed: 09/06/2012 Elsevier Interactive Patient Education  2017 Elsevier Inc.  

## 2017-10-28 ENCOUNTER — Other Ambulatory Visit (HOSPITAL_COMMUNITY): Payer: Self-pay | Admitting: *Deleted

## 2017-10-28 ENCOUNTER — Ambulatory Visit (HOSPITAL_COMMUNITY)
Admission: RE | Admit: 2017-10-28 | Discharge: 2017-10-28 | Disposition: A | Discharge status: Home / Self Care | Payer: Managed Care, Other (non HMO) | Source: Ambulatory Visit | Attending: Advanced Practice Midwife | Admitting: Advanced Practice Midwife

## 2017-10-28 ENCOUNTER — Encounter (HOSPITAL_COMMUNITY): Payer: Self-pay

## 2017-10-28 ENCOUNTER — Other Ambulatory Visit: Payer: Self-pay | Admitting: Advanced Practice Midwife

## 2017-10-28 DIAGNOSIS — Z3480 Encounter for supervision of other normal pregnancy, unspecified trimester: Secondary | ICD-10-CM

## 2017-10-28 DIAGNOSIS — Z362 Encounter for other antenatal screening follow-up: Secondary | ICD-10-CM

## 2017-10-28 DIAGNOSIS — Z3A18 18 weeks gestation of pregnancy: Secondary | ICD-10-CM | POA: Diagnosis not present

## 2017-10-28 DIAGNOSIS — O289 Unspecified abnormal findings on antenatal screening of mother: Secondary | ICD-10-CM

## 2017-10-28 DIAGNOSIS — Z3689 Encounter for other specified antenatal screening: Secondary | ICD-10-CM | POA: Diagnosis present

## 2017-10-28 DIAGNOSIS — Z363 Encounter for antenatal screening for malformations: Secondary | ICD-10-CM

## 2017-10-28 DIAGNOSIS — Z87798 Personal history of other (corrected) congenital malformations: Secondary | ICD-10-CM

## 2017-10-28 DIAGNOSIS — Z3A19 19 weeks gestation of pregnancy: Secondary | ICD-10-CM

## 2017-10-28 NOTE — Addendum Note (Signed)
Encounter addended by: Lenise ArenaBazemore, Tanee Henery, RDMS on: 10/28/2017 9:36 AM  Actions taken: Imaging Exam ended

## 2017-11-08 ENCOUNTER — Encounter: Payer: 59 | Admitting: Certified Nurse Midwife

## 2017-11-11 ENCOUNTER — Encounter: Payer: Self-pay | Admitting: Certified Nurse Midwife

## 2017-11-11 ENCOUNTER — Ambulatory Visit (INDEPENDENT_AMBULATORY_CARE_PROVIDER_SITE_OTHER): Payer: 59 | Admitting: Certified Nurse Midwife

## 2017-11-11 DIAGNOSIS — Z3402 Encounter for supervision of normal first pregnancy, second trimester: Secondary | ICD-10-CM

## 2017-11-11 DIAGNOSIS — Z3480 Encounter for supervision of other normal pregnancy, unspecified trimester: Secondary | ICD-10-CM

## 2017-11-11 NOTE — Progress Notes (Signed)
Patient reports good fetal movement, denies pain. 

## 2017-11-11 NOTE — Progress Notes (Signed)
   PRENATAL VISIT NOTE  Subjective:  Alyssa Munoz is a 22 y.o. G1P0 at 39w5dbeing seen today for ongoing prenatal care.  She is currently monitored for the following issues for this low-risk pregnancy and has MDD (major depressive disorder), recurrent severe, without psychosis ([place]; Wolff-Parkinson-White (WPW) syndrome; H/O cardiac radiofrequency ablation; Supervision of other normal pregnancy, antepartum; Abnormal antenatal test; and [redacted] weeks gestation of pregnancy on their problem list.  Patient reports no complaints.  Contractions: Not present. Vag. Bleeding: None.  Movement: Present. Denies leaking of fluid.   The following portions of the patient's history were reviewed and updated as appropriate: allergies, current medications, past family history, past medical history, past social history, past surgical history and problem list. Problem list updated.  Objective:   Vitals:   11/11/17 1108  BP: 108/67  Pulse: 83  Weight: 111 lb 9.6 oz (50.6 kg)    Fetal Status: Fetal Heart Rate (bpm): 150; doppler Fundal Height: 19 cm Movement: Present     General:  Alert, oriented and cooperative. Patient is in no acute distress.  Skin: Skin is warm and dry. No rash noted.   Cardiovascular: Normal heart rate noted  Respiratory: Normal respiratory effort, no problems with respiration noted  Abdomen: Soft, gravid, appropriate for gestational age.  Pain/Pressure: Absent     Pelvic: Cervical exam deferred        Extremities: Normal range of motion.  Edema: None  Mental Status: Normal mood and affect. Normal behavior. Normal judgment and thought content.   Assessment and Plan:  Pregnancy: G1P0 at 257w5d1. Supervision of other normal pregnancy, antepartum      Doing well.  Met with genetics counselor: Amnio declined.  AFP today.  F/U anatomy scheduled for 11/29/17.  FOB present for exam.  - AFP, Serum, Open Spina Bifida  Preterm labor symptoms and general obstetric precautions including but  not limited to vaginal bleeding, contractions, leaking of fluid and fetal movement were reviewed in detail with the patient. Please refer to After Visit Summary for other counseling recommendations.  Return in about 1 month (around 12/09/2017) for [place] Future Appointments  Date Time Provider [place]5/13/2019 10:30 AM [facility] [place], CNM

## 2017-11-16 ENCOUNTER — Other Ambulatory Visit: Payer: Self-pay | Admitting: Certified Nurse Midwife

## 2017-11-16 DIAGNOSIS — Z3480 Encounter for supervision of other normal pregnancy, unspecified trimester: Secondary | ICD-10-CM

## 2017-11-16 LAB — AFP, SERUM, OPEN SPINA BIFIDA
AFP MoM: 1.26
AFP Value: 104.7 ng/mL
Gest. Age on Collection Date: 20.7 weeks
Maternal Age At EDD: 22.7 yr
OSBR Risk 1 IN: 10000
Test Results:: NEGATIVE
Weight: 111 [lb_av]

## 2017-11-26 ENCOUNTER — Encounter (HOSPITAL_COMMUNITY): Payer: Self-pay

## 2017-11-29 ENCOUNTER — Encounter (HOSPITAL_COMMUNITY): Payer: Self-pay

## 2017-11-29 ENCOUNTER — Ambulatory Visit (HOSPITAL_COMMUNITY)
Admission: RE | Admit: 2017-11-29 | Discharge: 2017-11-29 | Disposition: A | Discharge status: Home / Self Care | Payer: Managed Care, Other (non HMO) | Source: Ambulatory Visit | Attending: Advanced Practice Midwife | Admitting: Advanced Practice Midwife

## 2017-11-29 ENCOUNTER — Other Ambulatory Visit (HOSPITAL_COMMUNITY): Payer: Self-pay | Admitting: Obstetrics and Gynecology

## 2017-11-29 DIAGNOSIS — Z87798 Personal history of other (corrected) congenital malformations: Secondary | ICD-10-CM | POA: Insufficient documentation

## 2017-11-29 DIAGNOSIS — Z3A23 23 weeks gestation of pregnancy: Secondary | ICD-10-CM | POA: Diagnosis not present

## 2017-11-29 DIAGNOSIS — Z362 Encounter for other antenatal screening follow-up: Secondary | ICD-10-CM | POA: Insufficient documentation

## 2017-11-29 DIAGNOSIS — O289 Unspecified abnormal findings on antenatal screening of mother: Secondary | ICD-10-CM | POA: Insufficient documentation

## 2017-11-29 HISTORY — DX: Depression, unspecified: F32.A

## 2017-11-29 HISTORY — DX: Major depressive disorder, single episode, unspecified: F32.9

## 2017-11-29 NOTE — Addendum Note (Signed)
Encounter addended by: Vanetta Shawl, RT, RVT, RDMS on: 11/29/2017 12:17 PM  Actions taken: Imaging Exam ended

## 2017-12-09 ENCOUNTER — Encounter: Payer: Self-pay | Admitting: Certified Nurse Midwife

## 2017-12-09 ENCOUNTER — Ambulatory Visit (INDEPENDENT_AMBULATORY_CARE_PROVIDER_SITE_OTHER): Payer: Managed Care, Other (non HMO) | Admitting: Certified Nurse Midwife

## 2017-12-09 VITALS — BP 102/64 | HR 69 | Wt 123.0 lb

## 2017-12-09 DIAGNOSIS — I456 Pre-excitation syndrome: Secondary | ICD-10-CM

## 2017-12-09 DIAGNOSIS — Z3402 Encounter for supervision of normal first pregnancy, second trimester: Secondary | ICD-10-CM

## 2017-12-09 DIAGNOSIS — Z3480 Encounter for supervision of other normal pregnancy, unspecified trimester: Secondary | ICD-10-CM

## 2017-12-09 NOTE — Progress Notes (Signed)
   PRENATAL VISIT NOTE  Subjective:  Alyssa Munoz is a 22 y.o. G1P0 at [redacted]w[redacted]d being seen today for ongoing prenatal care.  She is currently monitored for the following issues for this low-risk pregnancy and has MDD (major depressive disorder), recurrent severe, without psychosis (HCC); Wolff-Parkinson-White (WPW) syndrome; H/O cardiac radiofrequency ablation; Supervision of other normal pregnancy, antepartum; and Abnormal antenatal test on their problem list.  Patient reports no complaints.  Contractions: Not present. Vag. Bleeding: None.  Movement: Present. Denies leaking of fluid.   The following portions of the patient's history were reviewed and updated as appropriate: allergies, current medications, past family history, past medical history, past social history, past surgical history and problem list. Problem list updated.  Objective:   Vitals:   12/09/17 0847  BP: 102/64  Pulse: 69  Weight: 123 lb (55.8 kg)    Fetal Status: Fetal Heart Rate (bpm): 143; doppler Fundal Height: 23 cm Movement: Present     General:  Alert, oriented and cooperative. Patient is in no acute distress.  Skin: Skin is warm and dry. No rash noted.   Cardiovascular: Normal heart rate noted  Respiratory: Normal respiratory effort, no problems with respiration noted  Abdomen: Soft, gravid, appropriate for gestational age.  Pain/Pressure: Present     Pelvic: Cervical exam deferred        Extremities: Normal range of motion.     Mental Status: Normal mood and affect. Normal behavior. Normal judgment and thought content.   Assessment and Plan:  Pregnancy: G1P0 at [redacted]w[redacted]d  1. Supervision of other normal pregnancy, antepartum     Doing well.   2. Wolff-Parkinson-White (WPW) syndrome     Hx of ablation, last seen cardiology 3 years ago.   - Ambulatory referral to Cardiology  Preterm labor symptoms and general obstetric precautions including but not limited to vaginal bleeding, contractions, leaking of fluid  and fetal movement were reviewed in detail with the patient. Please refer to After Visit Summary for other counseling recommendations.  Return in about 1 month (around 01/06/2018) for ROB, 2 hr OGTT.  No future appointments.  Roe Coombs, CNM

## 2018-01-03 ENCOUNTER — Ambulatory Visit (INDEPENDENT_AMBULATORY_CARE_PROVIDER_SITE_OTHER): Payer: Managed Care, Other (non HMO) | Admitting: Certified Nurse Midwife

## 2018-01-03 ENCOUNTER — Other Ambulatory Visit: Payer: Managed Care, Other (non HMO)

## 2018-01-03 ENCOUNTER — Encounter: Payer: Self-pay | Admitting: Certified Nurse Midwife

## 2018-01-03 VITALS — BP 114/65 | HR 73 | Wt 123.0 lb

## 2018-01-03 DIAGNOSIS — Z3480 Encounter for supervision of other normal pregnancy, unspecified trimester: Secondary | ICD-10-CM

## 2018-01-03 DIAGNOSIS — Z3483 Encounter for supervision of other normal pregnancy, third trimester: Secondary | ICD-10-CM

## 2018-01-03 DIAGNOSIS — I456 Pre-excitation syndrome: Secondary | ICD-10-CM

## 2018-01-03 NOTE — Progress Notes (Signed)
   PRENATAL VISIT NOTE  Subjective:  Alyssa Munoz is a 22 y.o. G1P0 at 2652w2d being seen today for ongoing prenatal care.  She is currently monitored for the following issues for this low-risk pregnancy and has MDD (major depressive disorder), recurrent severe, without psychosis (HCC); Wolff-Parkinson-White (WPW) syndrome; H/O cardiac radiofrequency ablation; Supervision of other normal pregnancy, antepartum; and Abnormal antenatal test on their problem list.  Patient reports no complaints.  Contractions: Not present. Vag. Bleeding: None.  Movement: Present. Denies leaking of fluid.   The following portions of the patient's history were reviewed and updated as appropriate: allergies, current medications, past family history, past medical history, past social history, past surgical history and problem list. Problem list updated.  Objective:   Vitals:   01/03/18 0855  BP: 114/65  Pulse: 73  Weight: 123 lb (55.8 kg)    Fetal Status: Fetal Heart Rate (bpm): 140; doppler Fundal Height: 28 cm Movement: Present     General:  Alert, oriented and cooperative. Patient is in no acute distress.  Skin: Skin is warm and dry. No rash noted.   Cardiovascular: Normal heart rate noted  Respiratory: Normal respiratory effort, no problems with respiration noted  Abdomen: Soft, gravid, appropriate for gestational age.  Pain/Pressure: Present     Pelvic: Cervical exam deferred        Extremities: Normal range of motion.     Mental Status: Normal mood and affect. Normal behavior. Normal judgment and thought content.   Assessment and Plan:  Pregnancy: G1P0 at 1852w2d  1. Supervision of other normal pregnancy, antepartum      Doing well.  F/U growth ordered per MFM.  - Glucose Tolerance, 2 Hours w/1 Hour - CBC - HIV antibody - RPR - US MFM OB FOLLOW UP; Future  2. Wolff-Parkinson-White (WPW) syndrome     Has appointment scheduled with cardiology for 01/26/18.   - US MFM OB FOLLOW UP;  Future  Preterm labor symptoms and general obstetric precautions including but not limited to vaginal bleeding, contractions, leaking of fluid and fetal movement were reviewed in detail with the patient. Please refer to After Visit Summary for other counseling recommendations.  Return in about 2 weeks (around 01/17/2018) for ROB.  Future Appointments  Date Time Provider Department Center  01/03/2018  9:15 AM Roe Coombsenney, Halli Equihua A, CNM [facility] None  01/26/2018  3:15 PM Beatrice LecherWeaver, Scott T, PA-C [facility]    Roe Coombsachelle A Future [place], CNM

## 2018-01-04 LAB — GLUCOSE TOLERANCE, 2 HOURS W/ 1HR
Glucose, 1 hour: 113 mg/dL (ref 65–179)
Glucose, 2 hour: 97 mg/dL (ref 65–152)
Glucose, Fasting: 72 mg/dL (ref 65–91)

## 2018-01-04 LAB — CBC
Hematocrit: 29.4 % — ABNORMAL LOW (ref 34.0–46.6)
Hemoglobin: 9.6 g/dL — ABNORMAL LOW (ref 11.1–15.9)
MCH: 32 pg (ref 26.6–33.0)
MCHC: 32.7 g/dL (ref 31.5–35.7)
MCV: 98 fL — ABNORMAL HIGH (ref 79–97)
Platelets: 215 10*3/uL (ref 150–450)
RBC: 3 x10E6/uL — ABNORMAL LOW (ref 3.77–5.28)
RDW: 14 % (ref 12.3–15.4)
WBC: 9.6 10*3/uL (ref 3.4–10.8)

## 2018-01-04 LAB — RPR: RPR Ser Ql: NONREACTIVE

## 2018-01-04 LAB — HIV ANTIBODY (ROUTINE TESTING W REFLEX): HIV Screen 4th Generation wRfx: NONREACTIVE

## 2018-01-07 ENCOUNTER — Other Ambulatory Visit: Payer: Self-pay | Admitting: Certified Nurse Midwife

## 2018-01-07 DIAGNOSIS — O99013 Anemia complicating pregnancy, third trimester: Secondary | ICD-10-CM

## 2018-01-07 DIAGNOSIS — Z3480 Encounter for supervision of other normal pregnancy, unspecified trimester: Secondary | ICD-10-CM

## 2018-01-07 DIAGNOSIS — D509 Iron deficiency anemia, unspecified: Secondary | ICD-10-CM

## 2018-01-07 MED ORDER — FUSION PLUS PO CAPS: 1.0000 | ORAL_CAPSULE | Freq: Every day | ORAL | 5 refills | Status: DC

## 2018-01-21 ENCOUNTER — Other Ambulatory Visit (HOSPITAL_COMMUNITY)
Admission: RE | Admit: 2018-01-21 | Discharge: 2018-01-21 | Disposition: A | Discharge status: Home / Self Care | Payer: Managed Care, Other (non HMO) | Source: Ambulatory Visit | Attending: Certified Nurse Midwife | Admitting: Certified Nurse Midwife

## 2018-01-21 ENCOUNTER — Ambulatory Visit (INDEPENDENT_AMBULATORY_CARE_PROVIDER_SITE_OTHER): Payer: Managed Care, Other (non HMO) | Admitting: Certified Nurse Midwife

## 2018-01-21 VITALS — BP 107/74 | HR 70 | Wt 131.3 lb

## 2018-01-21 DIAGNOSIS — Z3483 Encounter for supervision of other normal pregnancy, third trimester: Secondary | ICD-10-CM

## 2018-01-21 DIAGNOSIS — N898 Other specified noninflammatory disorders of vagina: Secondary | ICD-10-CM | POA: Insufficient documentation

## 2018-01-21 DIAGNOSIS — Z3480 Encounter for supervision of other normal pregnancy, unspecified trimester: Secondary | ICD-10-CM

## 2018-01-21 NOTE — Progress Notes (Signed)
Patient reports good fetal movement, with some uterine irritability.

## 2018-01-21 NOTE — Patient Instructions (Signed)
AREA PEDIATRIC/FAMILY PRACTICE PHYSICIANS  [facility] [address], [address] [place], [place]  [address] Phone - [phone]   Fax - [phone]  [facility] [address] [place], [place] [address] Phone - [phone]   Fax - [phone]  [facility] AM[facility] [address] [place], [place]  [address] Phone - [phone]   Fax - [phone]  [facility] [address] [place], [place]  [address] Phone - [phone]   Fax - [phone]  [facility] [address] [place], [place]  [address] Phone - [phone]   Fax - [phone]  [facility] [address], [address] [place], [place]  [address] Phone - [phone]   Fax - [phone]  [facility] [address] [place], [place]  [address] Phone - [phone]   Fax - [phone]  [facility] [address], [address] [place], [place]  [address] Phone - [phone]   Fax - [phone]  [facility] [address] [place], [place]  [address] Phone - [phone]   Fax - [phone] [facility] [address] [place], [place]  [address] Phone - [phone]   Fax - [phone]  [facility] [address] [place], [place]  [address] Phone - [phone]   Fax - [phone]  [facility] [address] [place], [place]  [address] Phone - [phone]   Fax - [phone]  [facility] [address], [address] [place], [place]  [address] Phone - [phone]   Fax - [phone]  SHILPA GOSRANI [address] [place], [place]  [address] Phone - [phone]  [facility] [address] [place], [place]  [address] Phone - [phone]   Fax - [phone]  [facility] [address] [place], [place]  [address] Phone - [phone]   Fax - [phone]  [facility] [address] [place], [place]  [address] Phone - [phone]   Fax - [phone]  [facility] [address] [place], [place]  [address] Phone - [phone]   Fax - [phone]   [facility] [address] [place], [place]  [address] Phone - [phone]   Fax - [phone]  [facility] [address] [place], [place]  [address] Phone - [phone]   Fax - [phone]  DAVID RUBIN [address] [place], [place]  [address] Phone - [phone]   Fax - [phone]  [facility] [address] , [place]  [address] Phone - [phone]   Fax - [phone]  [facility] [address] , [place]  [address] Phone - [phone]   Fax - [phone] [facility] [address] [place], [place]  [address] Phone - [phone]   Fax - [phone]  [facility] [address] [place], [place]  [address] Phone - [phone]   Fax - [phone]  [facility] [address] [place], [place]  [address] Phone - [phone]   Fax - [phone]  [facility] Charlene Flemming MD [address]   [address] Phone [phone]  Fax [phone]   

## 2018-01-21 NOTE — Progress Notes (Signed)
   PRENATAL VISIT NOTE  Subjective:  Alyssa Munoz is a 22 y.o. G1P0 at 6673w6d being seen today for ongoing prenatal care.  She is currently monitored for the following issues for this low-risk pregnancy and has MDD (major depressive disorder), recurrent severe, without psychosis (HCC); Wolff-Parkinson-White (WPW) syndrome; H/O cardiac radiofrequency ablation; Supervision of other normal pregnancy, antepartum; and Abnormal antenatal test on their problem list.  Patient reports no bleeding, no contractions, no cramping, no leaking and vaginal irritation.  Contractions: Irritability. Vag. Bleeding: None.  Movement: Present. Denies leaking of fluid.   The following portions of the patient's history were reviewed and updated as appropriate: allergies, current medications, past family history, past medical history, past social history, past surgical history and problem list. Problem list updated.  Objective:   Vitals:   01/21/18 0926  BP: 107/74  Pulse: 70  Weight: 131 lb 4.8 oz (59.6 kg)    Fetal Status: Fetal Heart Rate (bpm): 138; doppler Fundal Height: 30 cm Movement: Present     General:  Alert, oriented and cooperative. Patient is in no acute distress.  Skin: Skin is warm and dry. No rash noted.   Cardiovascular: Normal heart rate noted  Respiratory: Normal respiratory effort, no problems with respiration noted  Abdomen: Soft, gravid, appropriate for gestational age.  Pain/Pressure: Absent     Pelvic: Cervical exam deferred        Extremities: Normal range of motion.  Edema: None  Mental Status: Normal mood and affect. Normal behavior. Normal judgment and thought content.   Assessment and Plan:  Pregnancy: G1P0 at 4373w6d  1. Supervision of other normal pregnancy, antepartum      Doing well  2. Vaginal irritation      - Cervicovaginal ancillary only  Preterm labor symptoms and general obstetric precautions including but not limited to vaginal bleeding, contractions, leaking of  fluid and fetal movement were reviewed in detail with the patient. Please refer to After Visit Summary for other counseling recommendations.  Return in about 2 weeks (around 02/04/2018) for ROB.  Future Appointments  Date Time Provider Department Center  01/26/2018  3:15 PM Kennon RoundsWeaver, Scott T, PA-C [facility]  02/02/2018  8:30 AM [facility] US 1 [facility]    Roe Coombsachelle A Luna Audia, CNM

## 2018-01-24 LAB — CERVICOVAGINAL ANCILLARY ONLY
Bacterial vaginitis: NEGATIVE
Candida vaginitis: POSITIVE — AB
Chlamydia: NEGATIVE
Neisseria Gonorrhea: NEGATIVE
Trichomonas: NEGATIVE

## 2018-01-26 ENCOUNTER — Ambulatory Visit (INDEPENDENT_AMBULATORY_CARE_PROVIDER_SITE_OTHER): Payer: Managed Care, Other (non HMO) | Admitting: Physician Assistant

## 2018-01-26 ENCOUNTER — Encounter: Payer: Self-pay | Admitting: Physician Assistant

## 2018-01-26 VITALS — BP 102/60 | HR 81 | Ht 63.0 in | Wt 127.1 lb

## 2018-01-26 DIAGNOSIS — I456 Pre-excitation syndrome: Secondary | ICD-10-CM

## 2018-01-26 NOTE — Progress Notes (Signed)
Cardiology Office Note:    Date:  01/26/2018   ID:  Alyssa Munoz, DOB 09/25/1995, MRN [id number]  PCP:  Patient, No Pcp Per  Cardiologist:  New - Follow up with Cardiology as needed.    Referring MD: Roe Coombsenney, Rachelle A, CNM   Chief Complaint  Patient presents with  . Wolff-Parkinson-White Syndrome    History of Present Illness:    Alyssa Munoz is a 22 y.o. female with Wolff-Parkinson-White syndrome status post prior ablation procedure who is being seen today for the evaluation of WPW at the request of Roe CoombsDenney, Rachelle A, CNM.   Ms. Alyssa Munoz was previously followed by Dr[facility] Crist[facility]ory [facility].  Records indicate she had an EP study in the past demonstrating high risk pathway for sudden death and therefore underwent radiofrequency catheter ablation in 2012.  She is here today with her friend.  She is [redacted] weeks gestation.  She mainly wanted to see Cardiology prior to birth to make sure everything was ok.  She notes a hx of syncope prior to being diagnosed with WPW.  She has not had a recurrence since her ablation.  She gets occasional chest pain but this is a chronic symptom.  She is a little short of breath but thinks this may be related to her pregnancy.  She gets lightheaded if she takes her iron and MVI together.    No flowsheet data found.  Prior CV studies:   The following studies were reviewed today:  Pediatric echocardiogram 11/13/2009 Levocardia, at least 3 pulmonary veins identified connecting into the LA, normal pulmonary vein velocity, normal bilateral atrial size, normal tricuspid valve, normal mitral valve, normal RV/LV, normal pulmonic/aortic valves; normal origin and proximal course of the RCA, normal artery and proximal course of the left coronary artery  Past Medical History:  Diagnosis Date  . Anxiety   . Chronic chest wall pain 2013  . Depression   . History of Wolff-Parkinson-White (WPW) syndrome    s/p ablation 2011 [facility] (Peds Cards Dr. Cristy FolksGregory Fleming)  .  Panic attack     Past Surgical History:  Procedure Laterality Date  . CARDIAC ELECTROPHYSIOLOGY STUDY AND ABLATION     wolffe parkinson syndrome    Current Medications: Current Meds  Medication Sig  . Iron-FA-B Cmp-C-Biot-Probiotic (FUSION PLUS) CAPS Take 1 tablet by mouth daily.  Burnis Medin. Prenat w/o A Vit-FeFum-FePo-FA (CONCEPT OB) 130-92.4-1 MG CAPS Take 1 tablet by mouth daily.     Allergies:   Latex   Social History   Socioeconomic History  . Marital status: Single    Spouse name: Not on file  . Number of children: Not on file  . Years of education: Not on file  . Highest education level: Not on file  Occupational History  . Occupation: Associate Professorcosmetologist  Social Needs  . Financial resource strain: Not on file  . Food insecurity:    Worry: Not on file    Inability: Not on file  . Transportation needs:    Medical: Not on file    Non-medical: Not on file  Tobacco Use  . Smoking status: Former Smoker    Packs/day: 0.25    Types: Cigarettes    Last attempt to quit: 08/2017    Years since quitting: 0.4  . Smokeless tobacco: Never Used  Substance and Sexual Activity  . Alcohol use: No  . Drug use: Not Currently    Types: Marijuana  . Sexual activity: Yes  Lifestyle  . Physical activity:  Days per week: Not on file    Minutes per session: Not on file  . Stress: Not on file  Relationships  . Social connections:    Talks on phone: Not on file    Gets together: Not on file    Attends religious service: Not on file    Active member of club or organization: Not on file    Attends meetings of clubs or organizations: Not on file    Relationship status: Not on file  Other Topics Concern  . Not on file  Social History Narrative   ** Merged History Encounter **         Family Hx: The patient's family history includes Diabetes in her maternal grandmother; Lung cancer in her maternal grandfather. There is no history of Sudden Cardiac Death.  ROS:   Please see the  history of present illness.    ROS All other systems reviewed and are negative.   EKGs/Labs/Other Test Reviewed:    EKG:  EKG is  ordered today.  The ekg ordered today demonstrates normal sinus rhythm, heart rate 81, normal axis, early repolarization, QTC 415,?  Delta wave leads II and V3  Recent Labs: 01/03/2018: Hemoglobin 9.6; Platelets 215   Recent Lipid Panel No results found for: CHOL, TRIG, HDL, CHOLHDL, LDLCALC, LDLDIRECT  Physical Exam:    VS:  BP 102/60   Pulse 81   Ht 5\' 3"  (1.6 m)   Wt 127 lb 1.9 oz (57.7 kg)   LMP 06/19/2017   SpO2 98%   BMI 22.52 kg/m     Wt Readings from Last 3 Encounters:  01/26/18 127 lb 1.9 oz (57.7 kg)  01/21/18 131 lb 4.8 oz (59.6 kg)  01/03/18 123 lb (55.8 kg)     Physical Exam  Constitutional: She is oriented to person, place, and time. She appears well-developed and well-nourished. No distress.  HENT:  Head: Normocephalic and atraumatic.  Neck: No JVD present. Carotid bruit is not present.  Cardiovascular: Normal rate, regular rhythm and normal heart sounds.  No murmur heard. Pulmonary/Chest: Effort normal and breath sounds normal. She has no rales.  Abdominal: Soft.  C/w [redacted] weeks gestation  Musculoskeletal: She exhibits no edema.  Neurological: She is alert and oriented to person, place, and time.  Skin: Skin is warm and dry.    ASSESSMENT & PLAN:    Wolff-Parkinson-White (WPW) syndrome Hx of ablation with Dr. Meredeth Ide in 2012 at [facility].  I discussed her case with Dr. Johney Frame (attending MD).  The patient should have nothing to be concerned about as far as WPW is concerned as it relates to her pregnancy.  She should not have any arrhythmias as she had a prior ablation and the accessory pathway is no longer functional.  This is not typically felt to be a hereditary disorder and her child is at equivalent risk to the general population for WPW.  The patient's echo in the past was normal.  I reassured her today and she can follow up  with Cardiology as needed.     Dispo:  Return as needed.   Medication Adjustments/Labs and Tests Ordered: Current medicines are reviewed at length with the patient today.  Concerns regarding medicines are outlined above.  Orders/Tests:  Orders Placed This Encounter  Procedures  . EKG 12-Lead   Medication changes: No orders of the defined types were placed in this encounter.  Signed, Tereso Newcomer, PA-C  01/26/2018 4:17 PM    [facility] [address], [place], [place]  [address] Phone: [phone]; Fax: [phone]

## 2018-01-26 NOTE — Patient Instructions (Signed)
Medication Instructions:  1. Your physician recommends that you continue on your current medications as directed. Please refer to the Current Medication list given to you today.   Labwork: NONE ORDERED TODAY  Testing/Procedures: NONE ORDERED TODAY  Follow-Up: FOLLOW UP AS NEEDED  Any Other Special Instructions Will Be Listed Below (If Applicable).     If you need a refill on your cardiac medications before your next appointment, please call your pharmacy.   

## 2018-01-27 ENCOUNTER — Other Ambulatory Visit: Payer: Self-pay | Admitting: Certified Nurse Midwife

## 2018-01-27 DIAGNOSIS — B3731 Acute candidiasis of vulva and vagina: Secondary | ICD-10-CM

## 2018-01-27 DIAGNOSIS — B373 Candidiasis of vulva and vagina: Secondary | ICD-10-CM

## 2018-01-27 MED ORDER — FLUCONAZOLE 150 MG PO TABS: 150.0000 mg | ORAL_TABLET | Freq: Once | ORAL | 0 refills | Status: AC

## 2018-01-27 MED ORDER — TERCONAZOLE 0.8 % VA CREA: 1.0000 | TOPICAL_CREAM | Freq: Every day | VAGINAL | 0 refills | Status: DC

## 2018-01-29 LAB — FERRITIN: Ferritin: 27 ng/mL (ref 15–150)

## 2018-01-29 LAB — SPECIMEN STATUS REPORT

## 2018-02-02 ENCOUNTER — Ambulatory Visit (HOSPITAL_COMMUNITY)
Admission: RE | Admit: 2018-02-02 | Discharge: 2018-02-02 | Disposition: A | Discharge status: Home / Self Care | Payer: Medicaid Other | Source: Ambulatory Visit | Attending: Certified Nurse Midwife | Admitting: Certified Nurse Midwife

## 2018-02-02 ENCOUNTER — Encounter (HOSPITAL_COMMUNITY): Payer: Self-pay

## 2018-02-02 ENCOUNTER — Ambulatory Visit (INDEPENDENT_AMBULATORY_CARE_PROVIDER_SITE_OTHER): Payer: Managed Care, Other (non HMO) | Admitting: Nurse Practitioner

## 2018-02-02 DIAGNOSIS — Z3480 Encounter for supervision of other normal pregnancy, unspecified trimester: Secondary | ICD-10-CM

## 2018-02-02 DIAGNOSIS — O9989 Other specified diseases and conditions complicating pregnancy, childbirth and the puerperium: Secondary | ICD-10-CM | POA: Diagnosis not present

## 2018-02-02 DIAGNOSIS — O289 Unspecified abnormal findings on antenatal screening of mother: Secondary | ICD-10-CM

## 2018-02-02 DIAGNOSIS — I456 Pre-excitation syndrome: Secondary | ICD-10-CM | POA: Diagnosis present

## 2018-02-02 DIAGNOSIS — Z3A32 32 weeks gestation of pregnancy: Secondary | ICD-10-CM

## 2018-02-02 DIAGNOSIS — Z3483 Encounter for supervision of other normal pregnancy, third trimester: Secondary | ICD-10-CM | POA: Insufficient documentation

## 2018-02-02 NOTE — Patient Instructions (Signed)
Childbirth Education Options: [facility] Department Classes:  Childbirth education classes can help you get ready for a positive parenting experience. You can also meet other expectant parents and get free stuff for your baby. Each class runs for five weeks on the same night and costs $45 for the mother-to-be and her support person. Medicaid covers the cost if you are eligible. Call [phone] to register. [facility] Childbirth Education:  [phone] or [phone] or [email or web address]  Baby & Me Class: Discuss newborn & infant parenting and family adjustment issues with other new mothers in a relaxed environment. Each week brings a new speaker or baby-centered activity. We encourage new mothers to join [place] every Thursday at 11:00am. Babies birth until crawling. No registration or fee. Daddy [employer]: This course offers Dads-to-be the tools and knowledge needed to feel confident on their journey to becoming new fathers. Experienced dads, who have been trained as coaches, teach dads-to-be how to hold, comfort, diaper, swaddle and play with their infant while being able to support the new mom as well. A class for men taught by men. $25/dad Big Brother/Big Sister: Let your children share in the joy of a new brother or sister in this special class designed just for them. Class includes discussion about how families care for babies: swaddling, holding, diapering, safety as well as how they can be helpful in their new role. This class is designed for children ages 45 to 48, but any age is welcome. Please register each child individually. $5/child  Mom Talk: This mom-led group offers support and connection to mothers as they journey through the adjustments and struggles of that sometimes overwhelming first year after the birth of a child. Tuesdays at 10:00am and Thursdays at 6:00pm. Babies welcome. No registration or fee. Breastfeeding Support Group: This group is a mother-to-mother  support circle where moms have the opportunity to share their breastfeeding experiences. A Lactation Consultant is present for questions and concerns. Meets each Tuesday at 11:00am. No fee or registration. Breastfeeding Your Baby: Learn what to expect in the first days of breastfeeding your newborn.  This class will help you feel more confident with the skills needed to begin your breastfeeding experience. Many new mothers are concerned about breastfeeding after leaving the hospital. This class will also address the most common fears and challenges about breastfeeding during the first few weeks, months and beyond. (call for fee) Comfort Techniques and Tour: This 2 hour interactive class will provide you the opportunity to learn & practice hands-on techniques that can help relieve some of the discomfort of labor and encourage your baby to rotate toward the best position for birth. You and your partner will be able to try a variety of labor positions with birth balls and rebozos as well as practice breathing, relaxation, and visualization techniques. A tour of the [facility] is included with this class. $20 per registrant and support person Childbirth Class- Weekend Option: This class is a Weekend version of our Birth & Baby series. It is designed for parents who have a difficult time fitting several weeks of classes into their schedule. It covers the care of your newborn and the basics of labor and childbirth. It also includes a [place] of [facility] and lunch. The class is held two consecutive days: beginning on Friday evening from 6:30 - 8:30 p.m. and the next day, Saturday from 9 a.m. - 4 p.m. (call for fee) Doren Custard Class: Interested in a waterbirth?  This  informational class will help you discover whether waterbirth is the right fit for you. Education about waterbirth itself, supplies you would need and how to assemble your support team is what you can  expect from this class. Some obstetrical practices require this class in order to pursue a waterbirth. (Not all obstetrical practices offer waterbirth-check with your healthcare provider.) Register only the expectant mom, but you are encouraged to bring your partner to class! Required if planning waterbirth, no fee. Infant/Child CPR: Parents, grandparents, babysitters, and friends learn Cardio-Pulmonary Resuscitation skills for infants and children. You will also learn how to treat both conscious and unconscious choking in infants and children. This Family & Friends program does not offer certification. Register each participant individually to ensure that enough mannequins are available. (Call for fee) Grandparent Love: Expecting a grandbaby? This class is for you! Learn about the latest infant care and safety recommendations and ways to support your own child as he or she transitions into the parenting role. Taught by Registered Nurses who are childbirth instructors, but most importantly...they are grandmothers too! $10/person. Childbirth Class- Natural Childbirth: This series of 5 weekly classes is for expectant parents who want to learn and practice natural methods of coping with the process of labor and childbirth. Relaxation, breathing, massage, visualization, role of the partner, and helpful positioning are highlighted. Participants learn how to be confident in their body's ability to give birth. This class will empower and help parents make informed decisions about their own care. Includes discussion that will help new parents transition into the immediate postpartum period. Maternity Care Center Tour of [facility] is included. We suggest taking this class between 25-32 weeks, but it's only a recommendation. $75 per registrant and one support person or $30 Medicaid. Childbirth Class- 3 week Series: This option of 3 weekly classes helps you and your labor partner prepare for childbirth. Newborn  care, labor & birth, cesarean birth, pain management, and comfort techniques are discussed and a Maternity Care Center Tour of [facility] is included. The class meets at the same time, on the same day of the week for 3 consecutive weeks beginning with the starting date you choose. $60 for registrant and one support person.  Marvelous Multiples: Expecting twins, triplets, or more? This class covers the differences in labor, birth, parenting, and breastfeeding issues that face multiples' parents. NICU tour is included. Led by a Certified Childbirth Educator who is the mother of twins. No fee. Caring for Baby: This class is for expectant and adoptive parents who want to learn and practice the most up-to-date newborn care for their babies. Focus is on birth through the first six weeks of life. Topics include feeding, bathing, diapering, crying, umbilical cord care, circumcision care and safe sleep. Parents learn to recognize symptoms of illness and when to call the pediatrician. Register only the mom-to-be and your partner or support person can plan to come with you! $10 per registrant and support person Childbirth Class- online option: This online class offers you the freedom to complete a Birth and Baby series in the comfort of your own home. The flexibility of this option allows you to review sections at your own pace, at times convenient to you and your support people. It includes additional video information, animations, quizzes, and extended activities. Get organized with helpful eClass tools, checklists, and trackers. Once you register online for the class, you will receive an email within a few days to accept the invitation and begin the class when the time   is right for you. The content will be available to you for 60 days. $60 for 60 days of online access for you and your support people.  Local Doulas: Natural Baby Doulas [email or web address] Tel:  [phone] [email or web address] [employer] [phone] [email or web address] The Labor Hassell Halim  (also do waterbirth tub rental) [phone] [email or web address] [email or web address] Birth Doula [phone] [email or web address] Sacred Rhythms  [phone] [email or web address]/ [employer] (PADA) [email or web address] [place] Birth and Baby  [email or web address] Considering Waterbirth? Guide for patients at [facility] [employer]  Why consider waterbirth?  . Gentle birth for babies . Less pain medicine used in labor . May allow for passive descent/less pushing . May reduce perineal tears  . More mobility and instinctive maternal position changes . Increased maternal relaxation . Reduced blood pressure in labor  Is waterbirth safe? What are the risks of infection, drowning or other complications?  . Infection: o Very low risk (3.7 % for tub vs 4.8% for bed) o 7 in 8000 waterbirths with documented infection o Poorly cleaned equipment most common cause o Slightly lower group B strep transmission rate  . Drowning o Maternal:  - Very low risk   - Related to seizures or fainting o Newborn:  - Very low risk. No evidence of increased risk of respiratory problems in multiple large studies - Physiological protection from breathing under water - Avoid underwater birth if there are any fetal complications - Once baby's head is out of the water, keep it out.  . Birth complication o Some reports of cord trauma, but risk decreased by bringing baby to surface gradually o No evidence of increased risk of shoulder dystocia. Mothers can usually change positions faster in water than in a bed, possibly aiding the maneuvers to free the shoulder.   You must attend a Doren Custard class at [facility]  3rd Wednesday of every month from 7-9pm  [employer] by calling [phone] or online at [email or web address]  Bring [place] the certificate from the class to your prenatal appointment  Meet with a midwife at 36 weeks to see if you can still plan a waterbirth and to sign the consent.   Purchase or rent the following supplies:   Water Birth Pool (Birth Pool in a Box or [place] for instance)  (Tubs start ~$125)  Single-use disposable tub liner designed for your brand of tub  New garden hose labeled "lead-free", "suitable for drinking water",  Electric drain pump to remove water (We recommend 792 gallon per hour or greater pump.)   Separate garden hose to remove the dirty water  Fish net  Bathing suit top (optional)  Long-handled mirror (optional)  Places to purchase or rent supplies  [email or web address].is for tub purchases and supplies  Waterbirthsolutions.com for tub purchases and supplies  The Labor Ladies (www.[email or web address]) $275 for tub rental/set-up & take down/kit   [employer] (http://www.fleming.com/.htm) Information regarding doulas (labor support) who provide pool rentals  Our practice has a Birth Pool in a Box tub at the hospital that you may borrow on a first-come-first-served basis. It is your responsibility to to set up, clean and break down the tub. We cannot guarantee the availability of this tub in advance. You are responsible for bringing all accessories listed above. If you do not have all necessary supplies you cannot have a waterbirth.    Things that would prevent you from having a waterbirth:  Premature, <37wks  Previous cesarean birth  Presence of thick meconium-stained fluid  Multiple gestation (Twins,  triplets, etc.)  Uncontrolled diabetes or gestational diabetes requiring medication  Hypertension requiring medication or diagnosis of pre-eclampsia  Heavy vaginal bleeding  Non-reassuring fetal  heart rate  Active infection (MRSA, etc.). Group B Strep is NOT a contraindication for  waterbirth.  If your labor has to be induced and induction method requires continuous  monitoring of the baby's heart rate  Other risks/issues identified by your obstetrical provider  Please remember that birth is unpredictable. Under certain unforeseeable circumstances your provider may advise against giving birth in the tub. These decisions will be made on a case-by-case basis and with the safety of you and your baby as our highest priority.  BENEFITS OF BREASTFEEDING Many women wonder if they should breastfeed. Research shows that breast milk contains the perfect balance of vitamins, protein and fat that your baby needs to grow. It also contains antibodies that help your baby's immune system to fight off viruses and bacteria and can reduce the risk of sudden infant death syndrome (SIDS). In addition, the colostrum (a fluid secreted from the breast in the first few days after delivery) helps your newborn's digestive system to grow and function well. Breast milk is easier to digest than formula. Also, if your baby is born preterm, breast milk can help to reduce both short- and long-term health problems. BENEFITS OF BREASTFEEDING FOR MOM . Breastfeeding causes a hormone to be released that helps the uterus to contract and return to its normal size more quickly. . It aids in postpartum weight loss, reduces risk of breast and ovarian cancer, heart disease and rheumatoid arthritis. . It decreases the amount of bleeding after the baby is born. benefits of breastfeeding for baby . Provides comfort and nutrition . Protects baby against - Obesity - Diabetes - Asthma - Childhood cancers - Heart disease - Ear infections - Diarrhea - Pneumonia - Stomach problems - Serious allergies - Skin rashes . Promotes growth and development . Reduces the risk of baby having Sudden Infant Death Syndrome (SIDS) only  breastmilk for the first 6 months . Protects baby against diseases/allergies . It's the perfect amount for tiny bellies . It restores baby's energy . Provides the best nutrition for baby . Giving water or formula can make baby more likely to get sick, decrease Mom's milk supply, make baby less content with breastfeeding Skin to Skin After delivery, the staff will place your baby on your chest. This helps with the following: . Regulates baby's temperature, breathing, heart rate and blood sugar . Increases Mom's milk supply . Promotes bonding . Keeps baby and Mom calm and decreases baby's crying Rooming In Your baby will stay in your room with you for the entire time you are in the hospital. This helps with the following: . Allows Mom to learn baby's feeding cues - Fluttering eyes - Sucking on tongue or hand - Rooting (opens mouth and turns head) - Nuzzling into the breast - Bringing hand to mouth . Allows breastfeeding on demand (when your baby is ready) . Helps baby to be calm and content . Ensures a good milk supply . Prevents complications with breastfeeding . Allows parents to learn to care for baby . Allows you to request assistance with breastfeeding Importance of a good latch . Increases milk transfer to baby - baby gets enough milk . Ensures you have enough milk for your baby . Decreases nipple soreness . Don't use pacifiers and bottles - these cause baby to suck differently than breastfeeding . Promotes continuation of breastfeeding   Risks of Formula Supplementation with Breastfeeding Giving your infant formula in addition to your breast-milk EXCEPT when medically necessary can lead to: . Decreases your milk supply  . Loss of confidence in yourself for providing baby's nutrition  . Engorgement and possibly mastitis  . Asthma & allergies in the baby BREASTFEEDING FAQS How long should I breastfeed my baby? It is recommended that you provide your baby with breast milk only  for the first 6 months and then continue for the first year and longer as desired. During the first few weeks after birth, your baby will need to feed 8-12 times every 24 hours, or every 2-3 hours. They will likely feed for 15-30 minutes. How can I help my baby begin breastfeeding? Babies are born with an instinct to breastfeed. A healthy baby can begin breastfeeding right away without specific help. At the hospital, a nurse (or lactation consultant) will help you begin the process and will give you tips on good positioning. It may be helpful to take a breastfeeding class before you deliver in order to know what to expect. How can I help my baby latch on? In order to assist your baby in latching-on, cup your breast in your hand and stroke your baby's lower lip with your nipple to stimulate your baby's rooting reflex. Your baby will look like he or she is yawning, at which point you should bring the baby towards your breast, while aiming the nipple at the roof of his or her mouth. Remember to bring the baby towards you and not your breast towards the baby. How can I tell if my baby is latched-on? Your baby will have all of your nipple and part of the dark area around the nipple in his or her mouth and your baby's nose will be touching your breast. You should see or hear the baby swallowing. If the baby is not latched-on properly, start the process over. To remove the suction, insert a clean finger between your breast and the baby's mouth. Should I switch breasts during feeding? After feeding on one side, switch the baby to your other breast. If he or she does not continue feeding - that is OK. Your baby will not necessarily need to feed from both breasts in a single feeding. On the next feeding, start with the other breast for efficiency and comfort. How can I tell if my baby is hungry? When your baby is hungry, they will nuzzle against your breast, make sucking noises and tongue motions and may put their  hands near their mouth. Crying is a late sign of hunger, so you should not wait until this point. When they have received enough milk, they will unlatch from the breast. Is it okay to use a pacifier? Until your baby gets the hang of breastfeeding, experts recommend limiting pacifier usage. If you have questions about this, please contact your pediatrician. What can I do to ensure proper nutrition while breastfeeding? . Make sure that you support your own health and your baby's by eating a healthy, well-balanced diet . Your provider may recommend that you continue to take your prenatal vitamin . Drink plenty of fluids. It is a good rule to drink one glass of water before or after feeding . Alcohol will remain in the breast milk for as long as it will remain in the blood stream. If you choose to have a drink, it is recommended that you wait at least 2 hours before feeding . Moderate amounts of caffeine are   OK . Some over-the-counter or prescription medications are not recommended during breastfeeding. Check with your provider if you have questions What types of birth control methods are safe while breastfeeding? Progestin-only methods, including a daily pill, an IUD, the implant and the injection are safe while breastfeeding. Methods that contain estrogen (such as combination birth control pills, the vaginal ring and the patch) should not be used during the first month of breastfeeding as these can decrease your milk supply.     

## 2018-02-02 NOTE — Progress Notes (Signed)
    Subjective:  Alyssa Munoz is a 22 y.o. G1P0 at [redacted]w[redacted]d being seen today for ongoing prenatal care.  She is currently monitored for the following issues for this low-risk pregnancy and has MDD (major depressive disorder), recurrent severe, without psychosis (HCC); Wolff-Parkinson-White (WPW) syndrome; H/O cardiac radiofrequency ablation; Supervision of other normal pregnancy, antepartum; and Abnormal antenatal test on their problem list.  Patient reports occasional contractions.  Contractions: Irregular. Vag. Bleeding: None.  Movement: Present. Denies leaking of fluid.   The following portions of the patient's history were reviewed and updated as appropriate: allergies, current medications, past family history, past medical history, past social history, past surgical history and problem list. Problem list updated.  Objective:   Vitals:   02/02/18 1123  BP: 102/63  Pulse: 78  Weight: 130 lb 6.4 oz (59.1 kg)    Fetal Status: Fetal Heart Rate (bpm): 148 Fundal Height: 33 cm Movement: Present     General:  Alert, oriented and cooperative. Patient is in no acute distress.  Skin: Skin is warm and dry. No rash noted.   Cardiovascular: Normal heart rate noted  Respiratory: Normal respiratory effort, no problems with respiration noted  Abdomen: Soft, gravid, appropriate for gestational age. Pain/Pressure: Absent     Pelvic:  Cervical exam deferred        Extremities: Normal range of motion.  Edema: None  Mental Status: Normal mood and affect. Normal behavior. Normal judgment and thought content.     Assessment and Plan:  Pregnancy: G1P0 at [redacted]w[redacted]d  1. Supervision of other normal pregnancy, antepartum Advised childbirth and breastfeeding classes Discussed risk of postpartum depression due to past history of depression - no current problems Advised intake of 64 ounces of fluids daily. Had interval grown ultrasound today.  Baby at 51% for growth. Saw cardiologist and note  reviewed.  Preterm labor symptoms and general obstetric precautions including but not limited to vaginal bleeding, contractions, leaking of fluid and fetal movement were reviewed in detail with the patient. Please refer to After Visit Summary for other counseling recommendations.  Return in about 2 weeks (around 02/16/2018).  Nolene BernheimERRI BURLESON, RN, MSN, NP-BC Nurse Practitioner, [facility], [facility] 02/02/2018 11:57 AM

## 2018-02-02 NOTE — Progress Notes (Signed)
Patient reports good fetal movement with some irregular contractions. 

## 2018-02-16 ENCOUNTER — Other Ambulatory Visit: Payer: Self-pay

## 2018-02-16 ENCOUNTER — Ambulatory Visit (INDEPENDENT_AMBULATORY_CARE_PROVIDER_SITE_OTHER): Payer: Managed Care, Other (non HMO) | Admitting: Advanced Practice Midwife

## 2018-02-16 VITALS — BP 107/68 | HR 70 | Wt 132.9 lb

## 2018-02-16 DIAGNOSIS — O479 False labor, unspecified: Secondary | ICD-10-CM

## 2018-02-16 DIAGNOSIS — I456 Pre-excitation syndrome: Secondary | ICD-10-CM

## 2018-02-16 DIAGNOSIS — Z3480 Encounter for supervision of other normal pregnancy, unspecified trimester: Secondary | ICD-10-CM

## 2018-02-16 DIAGNOSIS — O289 Unspecified abnormal findings on antenatal screening of mother: Secondary | ICD-10-CM

## 2018-02-16 MED ORDER — NIFEDIPINE 10 MG PO CAPS: 10.0000 mg | ORAL_CAPSULE | Freq: Four times a day (QID) | ORAL | 0 refills | Status: DC | PRN

## 2018-02-16 NOTE — Patient Instructions (Signed)
Third Trimester of Pregnancy The third trimester is from week 28 through week 40 (months 7 through 9). The third trimester is a time when the unborn baby (fetus) is growing rapidly. At the end of the ninth month, the fetus is about 20 inches in length and weighs 6-10 pounds. Body changes during your third trimester Your body will continue to go through many changes during pregnancy. The changes vary from woman to woman. During the third trimester:  Your weight will continue to increase. You can expect to gain 25-35 pounds (11-16 kg) by the end of the pregnancy.  You may begin to get stretch marks on your hips, abdomen, and breasts.  You may urinate more often because the fetus is moving lower into your pelvis and pressing on your bladder.  You may develop or continue to have heartburn. This is caused by increased hormones that slow down muscles in the digestive tract.  You may develop or continue to have constipation because increased hormones slow digestion and cause the muscles that push waste through your intestines to relax.  You may develop hemorrhoids. These are swollen veins (varicose veins) in the rectum that can itch or be painful.  You may develop swollen, bulging veins (varicose veins) in your legs.  You may have increased body aches in the pelvis, back, or thighs. This is due to weight gain and increased hormones that are relaxing your joints.  You may have changes in your hair. These can include thickening of your hair, rapid growth, and changes in texture. Some women also have hair loss during or after pregnancy, or hair that feels dry or thin. Your hair will most likely return to normal after your baby is born.  Your breasts will continue to grow and they will continue to become tender. A yellow fluid (colostrum) may leak from your breasts. This is the first milk you are producing for your baby.  Your belly button may stick out.  You may notice more swelling in your hands,  face, or ankles.  You may have increased tingling or numbness in your hands, arms, and legs. The skin on your belly may also feel numb.  You may feel short of breath because of your expanding uterus.  You may have more problems sleeping. This can be caused by the size of your belly, increased need to urinate, and an increase in your body's metabolism.  You may notice the fetus "dropping," or moving lower in your abdomen (lightening).  You may have increased vaginal discharge.  You may notice your joints feel loose and you may have pain around your pelvic bone.  What to expect at prenatal visits You will have prenatal exams every 2 weeks until week 36. Then you will have weekly prenatal exams. During a routine prenatal visit:  You will be weighed to make sure you and the baby are growing normally.  Your blood pressure will be taken.  Your abdomen will be measured to track your baby's growth.  The fetal heartbeat will be listened to.  Any test results from the previous visit will be discussed.  You may have a cervical check near your due date to see if your cervix has softened or thinned (effaced).  You will be tested for Group B streptococcus. This happens between 35 and 37 weeks.  Your health care provider may ask you:  What your birth plan is.  How you are feeling.  If you are feeling the baby move.  If you have had   any abnormal symptoms, such as leaking fluid, bleeding, severe headaches, or abdominal cramping.  If you are using any tobacco products, including cigarettes, chewing tobacco, and electronic cigarettes.  If you have any questions.  Other tests or screenings that may be performed during your third trimester include:  Blood tests that check for low iron levels (anemia).  Fetal testing to check the health, activity level, and growth of the fetus. Testing is done if you have certain medical conditions or if there are problems during the  pregnancy.  Nonstress test (NST). This test checks the health of your baby to make sure there are no signs of problems, such as the baby not getting enough oxygen. During this test, a belt is placed around your belly. The baby is made to move, and its heart rate is monitored during movement.  What is false labor? False labor is a condition in which you feel small, irregular tightenings of the muscles in the womb (contractions) that usually go away with rest, changing position, or drinking water. These are called Braxton Hicks contractions. Contractions may last for hours, days, or even weeks before true labor sets in. If contractions come at regular intervals, become more frequent, increase in intensity, or become painful, you should see your health care provider. What are the signs of labor?  Abdominal cramps.  Regular contractions that start at 10 minutes apart and become stronger and more frequent with time.  Contractions that start on the top of the uterus and spread down to the lower abdomen and back.  Increased pelvic pressure and dull back pain.  A watery or bloody mucus discharge that comes from the vagina.  Leaking of amniotic fluid. This is also known as your "water breaking." It could be a slow trickle or a gush. Let your health care provider know if it has a color or strange odor. If you have any of these signs, call your health care provider right away, even if it is before your due date. Follow these instructions at home: Medicines  Follow your health care provider's instructions regarding medicine use. Specific medicines may be either safe or unsafe to take during pregnancy.  Take a prenatal vitamin that contains at least 600 micrograms (mcg) of folic acid.  If you develop constipation, try taking a stool softener if your health care provider approves. Eating and drinking  Eat a balanced diet that includes fresh fruits and vegetables, whole grains, good sources of protein  such as meat, eggs, or tofu, and low-fat dairy. Your health care provider will help you determine the amount of weight gain that is right for you.  Avoid raw meat and uncooked cheese. These carry germs that can cause birth defects in the baby.  If you have low calcium intake from food, talk to your health care provider about whether you should take a daily calcium supplement.  Eat four or five small meals rather than three large meals a day.  Limit foods that are high in fat and processed sugars, such as fried and sweet foods.  To prevent constipation: ? Drink enough fluid to keep your urine clear or pale yellow. ? Eat foods that are high in fiber, such as fresh fruits and vegetables, whole grains, and beans. Activity  Exercise only as directed by your health care provider. Most women can continue their usual exercise routine during pregnancy. Try to exercise for 30 minutes at least 5 days a week. Stop exercising if you experience uterine contractions.  Avoid heavy   lifting.  Do not exercise in extreme heat or humidity, or at high altitudes.  Wear low-heel, comfortable shoes.  Practice good posture.  You may continue to have sex unless your health care provider tells you otherwise. Relieving pain and discomfort  Take frequent breaks and rest with your legs elevated if you have leg cramps or low back pain.  Take warm sitz baths to soothe any pain or discomfort caused by hemorrhoids. Use hemorrhoid cream if your health care provider approves.  Wear a good support bra to prevent discomfort from breast tenderness.  If you develop varicose veins: ? Wear support pantyhose or compression stockings as told by your healthcare provider. ? Elevate your feet for 15 minutes, 3-4 times a day. Prenatal care  Write down your questions. Take them to your prenatal visits.  Keep all your prenatal visits as told by your health care provider. This is important. Safety  Wear your seat belt at  all times when driving.  Make a list of emergency phone numbers, including numbers for family, friends, the hospital, and police and fire departments. General instructions  Avoid cat litter boxes and soil used by cats. These carry germs that can cause birth defects in the baby. If you have a cat, ask someone to clean the litter box for you.  Do not travel far distances unless it is absolutely necessary and only with the approval of your health care provider.  Do not use hot tubs, steam rooms, or saunas.  Do not drink alcohol.  Do not use any products that contain nicotine or tobacco, such as cigarettes and e-cigarettes. If you need help quitting, ask your health care provider.  Do not use any medicinal herbs or unprescribed drugs. These chemicals affect the formation and growth of the baby.  Do not douche or use tampons or scented sanitary pads.  Do not cross your legs for long periods of time.  To prepare for the arrival of your baby: ? Take prenatal classes to understand, practice, and ask questions about labor and delivery. ? Make a trial run to the hospital. ? Visit the hospital and tour the maternity area. ? Arrange for maternity or paternity leave through employers. ? Arrange for family and friends to take care of pets while you are in the hospital. ? Purchase a rear-facing car seat and make sure you know how to install it in your car. ? Pack your hospital bag. ? Prepare the baby's nursery. Make sure to remove all pillows and stuffed animals from the baby's crib to prevent suffocation.  Visit your dentist if you have not gone during your pregnancy. Use a soft toothbrush to brush your teeth and be gentle when you floss. Contact a health care provider if:  You are unsure if you are in labor or if your water has broken.  You become dizzy.  You have mild pelvic cramps, pelvic pressure, or nagging pain in your abdominal area.  You have lower back pain.  You have persistent  nausea, vomiting, or diarrhea.  You have an unusual or bad smelling vaginal discharge.  You have pain when you urinate. Get help right away if:  Your water breaks before 37 weeks.  You have regular contractions less than 5 minutes apart before 37 weeks.  You have a fever.  You are leaking fluid from your vagina.  You have spotting or bleeding from your vagina.  You have severe abdominal pain or cramping.  You have rapid weight loss or weight gain.    You have shortness of breath with chest pain.  You notice sudden or extreme swelling of your face, hands, ankles, feet, or legs.  Your baby makes fewer than 10 movements in 2 hours.  You have severe headaches that do not go away when you take medicine.  You have vision changes. Summary  The third trimester is from week 28 through week 40, months 7 through 9. The third trimester is a time when the unborn baby (fetus) is growing rapidly.  During the third trimester, your discomfort may increase as you and your baby continue to gain weight. You may have abdominal, leg, and back pain, sleeping problems, and an increased need to urinate.  During the third trimester your breasts will keep growing and they will continue to become tender. A yellow fluid (colostrum) may leak from your breasts. This is the first milk you are producing for your baby.  False labor is a condition in which you feel small, irregular tightenings of the muscles in the womb (contractions) that eventually go away. These are called Braxton Hicks contractions. Contractions may last for hours, days, or even weeks before true labor sets in.  Signs of labor can include: abdominal cramps; regular contractions that start at 10 minutes apart and become stronger and more frequent with time; watery or bloody mucus discharge that comes from the vagina; increased pelvic pressure and dull back pain; and leaking of amniotic fluid. This information is not intended to replace advice  given to you by your health care provider. Make sure you discuss any questions you have with your health care provider. Document Released: 06/30/2001 Document Revised: 12/12/2015 Document Reviewed: 09/06/2012 Elsevier Interactive Patient Education  2017 [employer]  

## 2018-02-16 NOTE — Progress Notes (Signed)
   PRENATAL VISIT NOTE  Subjective:  Alyssa Munoz is a 22 y.o. G1P0 at 6810w4d being seen today for ongoing prenatal care.  She is currently monitored for the following issues for this low-risk pregnancy and has MDD (major depressive disorder), recurrent severe, without psychosis (HCC); Wolff-Parkinson-White (WPW) syndrome; H/O cardiac radiofrequency ablation; Supervision of other normal pregnancy, antepartum; and Abnormal antenatal test on their problem list.  Patient reports episodes of frequent contractions, improved but not resolved by drinking more water.  Contractions: Not present. Vag. Bleeding: None.  Movement: Present. Denies leaking of fluid.   The following portions of the patient's history were reviewed and updated as appropriate: allergies, current medications, past family history, past medical history, past social history, past surgical history and problem list. Problem list updated.  Objective:   Vitals:   02/16/18 1539  BP: 107/68  Pulse: 70  Weight: 132 lb 14.4 oz (60.3 kg)    Fetal Status:     Movement: Present     General:  Alert, oriented and cooperative. Patient is in no acute distress.  Skin: Skin is warm and dry. No rash noted.   Cardiovascular: Normal heart rate noted  Respiratory: Normal respiratory effort, no problems with respiration noted  Abdomen: Soft, gravid, appropriate for gestational age.  Pain/Pressure: Present     Pelvic: Cervical exam performed        Extremities: Normal range of motion.  Edema: None  Mental Status: Normal mood and affect. Normal behavior. Normal judgment and thought content.   Assessment and Plan:  Pregnancy: G1P0 at 5910w4d  1. Supervision of other normal pregnancy, antepartum --Anticipatory guidance about next visits/weeks of pregnancy given.  2. Abnormal antenatal test --NIPS with risk for XXY (Klinefelter) --Declined diagnostic amnio --Normal growth US 7/17  3. Wolff-Parkinson-White (WPW) syndrome --Ablation done and  no arrhythmia now --Normal cardiology exam 7/10   4.  Braxton-Hicks Contractions --Cervix closed, no evidence of labor --Continue to increase PO fluids --Rest/ice/heat/warm bath/Tylenol --Procardia 10 mg Q 6 hours PRN for comfort  Preterm labor symptoms and general obstetric precautions including but not limited to vaginal bleeding, contractions, leaking of fluid and fetal movement were reviewed in detail with the patient. Please refer to After Visit Summary for other counseling recommendations.  Return in about 2 weeks (around 03/02/2018).  Future Appointments  Date Time Provider Department Center  02/16/2018  4:20 PM Leftwich-Kirby, Wilmer FloorLisa A, CNM [facility] None    Sharen CounterLisa Leftwich-Kirby, CNM

## 2018-02-28 ENCOUNTER — Encounter: Payer: Self-pay | Admitting: Nurse Practitioner

## 2018-03-04 ENCOUNTER — Ambulatory Visit (INDEPENDENT_AMBULATORY_CARE_PROVIDER_SITE_OTHER): Payer: Managed Care, Other (non HMO) | Admitting: Obstetrics and Gynecology

## 2018-03-04 ENCOUNTER — Encounter: Payer: Self-pay | Admitting: Obstetrics and Gynecology

## 2018-03-04 ENCOUNTER — Other Ambulatory Visit (HOSPITAL_COMMUNITY)
Admission: RE | Admit: 2018-03-04 | Discharge: 2018-03-04 | Disposition: A | Discharge status: Home / Self Care | Payer: Managed Care, Other (non HMO) | Source: Ambulatory Visit | Attending: Obstetrics and Gynecology | Admitting: Obstetrics and Gynecology

## 2018-03-04 VITALS — BP 100/68 | HR 79 | Wt 131.5 lb

## 2018-03-04 DIAGNOSIS — Z3A36 36 weeks gestation of pregnancy: Secondary | ICD-10-CM | POA: Diagnosis not present

## 2018-03-04 DIAGNOSIS — Z3480 Encounter for supervision of other normal pregnancy, unspecified trimester: Secondary | ICD-10-CM

## 2018-03-04 DIAGNOSIS — O26893 Other specified pregnancy related conditions, third trimester: Secondary | ICD-10-CM | POA: Insufficient documentation

## 2018-03-04 DIAGNOSIS — I456 Pre-excitation syndrome: Secondary | ICD-10-CM | POA: Diagnosis not present

## 2018-03-04 DIAGNOSIS — O99343 Other mental disorders complicating pregnancy, third trimester: Secondary | ICD-10-CM | POA: Insufficient documentation

## 2018-03-04 DIAGNOSIS — Z3483 Encounter for supervision of other normal pregnancy, third trimester: Secondary | ICD-10-CM | POA: Diagnosis present

## 2018-03-04 DIAGNOSIS — F338 Other recurrent depressive disorders: Secondary | ICD-10-CM | POA: Diagnosis not present

## 2018-03-04 NOTE — Progress Notes (Signed)
   PRENATAL VISIT NOTE  Subjective:  Alyssa Munoz is a 22 y.o. G1P0 at 7225w6d being seen today for ongoing prenatal care.  She is currently monitored for the following issues for this low-risk pregnancy and has MDD (major depressive disorder), recurrent severe, without psychosis (HCC); Wolff-Parkinson-White (WPW) syndrome; H/O cardiac radiofrequency ablation; Supervision of other normal pregnancy, antepartum; and Abnormal antenatal test on their problem list.  Patient reports no complaints.  Contractions: Irregular. Vag. Bleeding: None.  Movement: Present. Denies leaking of fluid.   The following portions of the patient's history were reviewed and updated as appropriate: allergies, current medications, past family history, past medical history, past social history, past surgical history and problem list. Problem list updated.  Objective:   Vitals:   03/04/18 1058  BP: 100/68  Pulse: 79  Weight: 131 lb 8 oz (59.6 kg)    Fetal Status: Fetal Heart Rate (bpm): 140 Fundal Height: 34 cm Movement: Present  Presentation: Vertex  General:  Alert, oriented and cooperative. Patient is in no acute distress.  Skin: Skin is warm and dry. No rash noted.   Cardiovascular: Normal heart rate noted  Respiratory: Normal respiratory effort, no problems with respiration noted  Abdomen: Soft, gravid, appropriate for gestational age.  Pain/Pressure: Present     Pelvic: Cervical exam performed Dilation: Closed Effacement (%): Thick Station: Ballotable  Extremities: Normal range of motion.  Edema: None  Mental Status: Normal mood and affect. Normal behavior. Normal judgment and thought content.   Assessment and Plan:  Pregnancy: G1P0 at 2925w6d  1. Supervision of other normal pregnancy, antepartum - Strep Gp B NAA - Cervicovaginal ancillary only  Preterm labor symptoms and general obstetric precautions including but not limited to vaginal bleeding, contractions, leaking of fluid and fetal movement were  reviewed in detail with the patient. Please refer to After Visit Summary for other counseling recommendations.  Return in about 1 week (around 03/11/2018) for Return OB visit.  No future appointments.  Raelyn Moraolitta Kadeja Granada, CNM

## 2018-03-04 NOTE — Patient Instructions (Signed)
Preventing Preterm Birth Preterm birth is when your baby is delivered between 72 weeks and 37 weeks of pregnancy. A full-term pregnancy lasts for at least 37 weeks. Preterm birth can be dangerous for your baby because the last few weeks of pregnancy are an important time for your baby's brain and lungs to grow. Many things can cause a baby to be born early. Sometimes the cause is not known. There are certain factors that make you more likely to experience preterm birth, such as:  Having a previous baby born preterm.  Being pregnant with twins or other multiples.  Having had fertility treatment.  Being overweight or underweight at the start of your pregnancy.  Having any of the following during pregnancy: ? An infection, including a urinary tract infection (UTI) or an STI (sexually transmitted infection). ? High blood pressure. ? Diabetes. ? Vaginal bleeding.  Being age 38 or older.  Being age 50 or younger.  Getting pregnant within 6 months of a previous pregnancy.  Suffering extreme stress or physical or emotional abuse during pregnancy.  Standing for long periods of time during pregnancy, such as working at a job that requires standing.  What are the risks? The most serious risk of preterm birth is that the baby may not survive. This is more likely to happen if a baby is born before 13 weeks. Other risks and complications of preterm birth may include your baby having:  Breathing problems.  Brain damage that affects movement and coordination (cerebral palsy).  Feeding difficulties.  Vision or hearing problems.  Infections or inflammation of the digestive tract (colitis).  Developmental delays.  Learning disabilities.  Higher risk for diabetes, heart disease, and high blood pressure later in life.  What can I do to lower my risk? Medical care  The most important thing you can do to lower your risk for preterm birth is to get routine medical care during pregnancy  (prenatal care). If you have a high risk of preterm birth, you may be referred to a health care provider who specializes in managing high-risk pregnancies (perinatologist). You may be given medicine to help prevent preterm birth. Lifestyle changes Certain lifestyle changes can also lower your risk of preterm birth:  Wait at least 6 months after a pregnancy to become pregnant again.  Try to plan pregnancy for when you are between 57 and 77 years old.  Get to a healthy weight before getting pregnant. If you are overweight, work with your health care provider to safely lose weight.  Do not use any products that contain nicotine or tobacco, such as cigarettes and e-cigarettes. If you need help quitting, ask your health care provider.  Do not drink alcohol.  Do not use drugs.  Where to find support: For more support, consider:  Talking with your health care provider.  Talking with a therapist or substance abuse counselor, if you need help quitting.  Working with a diet and nutrition specialist (dietitian) or a Physiological scientist to maintain a healthy weight.  Joining a support group.  Where to find more information: Learn more about preventing preterm birth from:  Centers for Disease Control and Prevention: VoipObserver.com.br  March of Dimes: marchofdimes.org/complications/premature-babies.aspx  American Pregnancy Association: americanpregnancy.org/labor-and-birth/premature-labor  Contact a health care provider if:  You have any of the following signs of preterm labor before 37 weeks: ? A change or increase in vaginal discharge. ? Fluid leaking from your vagina. ? Pressure or cramps in your lower abdomen. ? A backache that does not  go away or gets worse. ? Regular tightening (contractions) in your lower abdomen. Summary  Preterm birth means having your baby during weeks 20-37 of pregnancy.  Preterm birth may put your baby at risk  for physical and mental problems.  Getting good prenatal care can help prevent preterm birth.  You can lower your risk of preterm birth by making certain lifestyle changes, such as not smoking and not using alcohol. This information is not intended to replace advice given to you by your health care provider. Make sure you discuss any questions you have with your health care provider. Document Released: 08/20/2015 Document Revised: 03/14/2016 Document Reviewed: 03/14/2016 Elsevier Interactive Patient Education  2018 [employer] CorporationBraxton Hicks Contractions Contractions of the uterus can occur throughout pregnancy, but they are not always a sign that you are in labor. You may have practice contractions called Braxton Hicks contractions. These false labor contractions are sometimes confused with true labor. What are Deberah PeltonBraxton Hicks contractions? Braxton Hicks contractions are tightening movements that occur in the muscles of the uterus before labor. Unlike true labor contractions, these contractions do not result in opening (dilation) and thinning of the cervix. Toward the end of pregnancy (32-34 weeks), Braxton Hicks contractions can happen more often and may become stronger. These contractions are sometimes difficult to tell apart from true labor because they can be very uncomfortable. You should not feel embarrassed if you go to the hospital with false labor. Sometimes, the only way to tell if you are in true labor is for your health care provider to look for changes in the cervix. The health care provider will do a physical exam and may monitor your contractions. If you are not in true labor, the exam should show that your cervix is not dilating and your water has not broken. If there are other health problems associated with your pregnancy, it is completely safe for you to be sent home with false labor. You may continue to have Braxton Hicks contractions until you go into true labor. How to tell the  difference between true labor and false labor True labor  Contractions last 30-70 seconds.  Contractions become very regular.  Discomfort is usually felt in the top of the uterus, and it spreads to the lower abdomen and low back.  Contractions do not go away with walking.  Contractions usually become more intense and increase in frequency.  The cervix dilates and gets thinner. False labor  Contractions are usually shorter and not as strong as true labor contractions.  Contractions are usually irregular.  Contractions are often felt in the front of the lower abdomen and in the groin.  Contractions may go away when you walk around or change positions while lying down.  Contractions get weaker and are shorter-lasting as time goes on.  The cervix usually does not dilate or become thin. Follow these instructions at home:  Take over-the-counter and prescription medicines only as told by your health care provider.  Keep up with your usual exercises and follow other instructions from your health care provider.  Eat and drink lightly if you think you are going into labor.  If Braxton Hicks contractions are making you uncomfortable: ? Change your position from lying down or resting to walking, or change from walking to resting. ? Sit and rest in a tub of warm water. ? Drink enough fluid to keep your urine pale yellow. Dehydration may cause these contractions. ? Do slow and deep breathing several times an hour.  Keep  all follow-up prenatal visits as told by your health care provider. This is important. Contact a health care provider if:  You have a fever.  You have continuous pain in your abdomen. Get help right away if:  Your contractions become stronger, more regular, and closer together.  You have fluid leaking or gushing from your vagina.  You pass blood-tinged mucus (bloody show).  You have bleeding from your vagina.  You have low back pain that you never had  before.  You feel your baby's head pushing down and causing pelvic pressure.  Your baby is not moving inside you as much as it used to. Summary  Contractions that occur before labor are called Braxton Hicks contractions, false labor, or practice contractions.  Braxton Hicks contractions are usually shorter, weaker, farther apart, and less regular than true labor contractions. True labor contractions usually become progressively stronger and regular and they become more frequent.  Manage discomfort from [facility] CityBraxton Hicks contractions by changing position, resting in a warm bath, drinking plenty of water, or practicing deep breathing. This information is not intended to replace advice given to you by your health care provider. Make sure you discuss any questions you have with your health care provider. Document Released: 11/19/2016 Document Revised: 11/19/2016 Document Reviewed: 11/19/2016 Elsevier Interactive Patient Education  2018 [employer]

## 2018-03-06 LAB — STREP GP B NAA: Strep Gp B NAA: POSITIVE — AB

## 2018-03-07 ENCOUNTER — Encounter: Payer: Self-pay | Admitting: Advanced Practice Midwife

## 2018-03-07 DIAGNOSIS — B951 Streptococcus, group B, as the cause of diseases classified elsewhere: Secondary | ICD-10-CM | POA: Insufficient documentation

## 2018-03-08 LAB — CERVICOVAGINAL ANCILLARY ONLY
Chlamydia: NEGATIVE
Neisseria Gonorrhea: NEGATIVE

## 2018-03-10 ENCOUNTER — Ambulatory Visit (INDEPENDENT_AMBULATORY_CARE_PROVIDER_SITE_OTHER): Payer: Managed Care, Other (non HMO) | Admitting: Obstetrics

## 2018-03-10 ENCOUNTER — Encounter: Payer: Self-pay | Admitting: Obstetrics

## 2018-03-10 VITALS — BP 112/69 | HR 68 | Wt 131.5 lb

## 2018-03-10 DIAGNOSIS — Z3480 Encounter for supervision of other normal pregnancy, unspecified trimester: Secondary | ICD-10-CM

## 2018-03-10 DIAGNOSIS — O289 Unspecified abnormal findings on antenatal screening of mother: Secondary | ICD-10-CM

## 2018-03-10 DIAGNOSIS — Z3483 Encounter for supervision of other normal pregnancy, third trimester: Secondary | ICD-10-CM

## 2018-03-10 NOTE — Progress Notes (Signed)
Pt presents for ROB. She wants to know how much does the baby weigh.

## 2018-03-10 NOTE — Progress Notes (Signed)
Subjective:  Alyssa Munoz is a 22 y.o. G1P0 at 8288w5d being seen today for ongoing prenatal care.  She is currently monitored for the following issues for this low-risk pregnancy and has MDD (major depressive disorder), recurrent severe, without psychosis (HCC); Wolff-Parkinson-White (WPW) syndrome; H/O cardiac radiofrequency ablation; Supervision of other normal pregnancy, antepartum; Abnormal antenatal test; and Group beta Strep positive on their problem list.  Patient reports occasional contractions.  Contractions: Irregular. Vag. Bleeding: None.  Movement: Present. Denies leaking of fluid.   The following portions of the patient's history were reviewed and updated as appropriate: allergies, current medications, past family history, past medical history, past social history, past surgical history and problem list. Problem list updated.  Objective:   Vitals:   03/10/18 1010  BP: 112/69  Pulse: 68  Weight: 131 lb 8 oz (59.6 kg)    Fetal Status: Fetal Heart Rate (bpm): 140   Movement: Present     General:  Alert, oriented and cooperative. Patient is in no acute distress.  Skin: Skin is warm and dry. No rash noted.   Cardiovascular: Normal heart rate noted  Respiratory: Normal respiratory effort, no problems with respiration noted  Abdomen: Soft, gravid, appropriate for gestational age. Pain/Pressure: Present     Pelvic:  Cervical exam deferred        Extremities: Normal range of motion.  Edema: None  Mental Status: Normal mood and affect. Normal behavior. Normal judgment and thought content.   Urinalysis:      Assessment and Plan:  Pregnancy: G1P0 at 7188w5d  1. Supervision of other normal pregnancy, antepartum-    Term labor symptoms and general obstetric precautions including but not limited to vaginal bleeding, contractions, leaking of fluid and fetal movement were reviewed in detail with the patient. Please refer to After Visit Summary for other counseling recommendations.   Return in about 1 week (around 03/17/2018) for ROB.   Brock BadHarper, Natale Barba A, MD

## 2018-03-17 ENCOUNTER — Ambulatory Visit (INDEPENDENT_AMBULATORY_CARE_PROVIDER_SITE_OTHER): Payer: Managed Care, Other (non HMO) | Admitting: Obstetrics

## 2018-03-17 ENCOUNTER — Encounter: Payer: Self-pay | Admitting: Obstetrics

## 2018-03-17 VITALS — BP 112/78 | HR 64 | Wt 134.6 lb

## 2018-03-17 DIAGNOSIS — Z3480 Encounter for supervision of other normal pregnancy, unspecified trimester: Secondary | ICD-10-CM

## 2018-03-17 DIAGNOSIS — I456 Pre-excitation syndrome: Secondary | ICD-10-CM

## 2018-03-17 DIAGNOSIS — O289 Unspecified abnormal findings on antenatal screening of mother: Secondary | ICD-10-CM

## 2018-03-17 DIAGNOSIS — Z3483 Encounter for supervision of other normal pregnancy, third trimester: Secondary | ICD-10-CM

## 2018-03-17 DIAGNOSIS — Z9889 Other specified postprocedural states: Secondary | ICD-10-CM

## 2018-03-17 NOTE — Progress Notes (Signed)
Subjective:  Alyssa Munoz is a 22 y.o. G1P0 at 8229w5d being seen today for ongoing prenatal care.  She is currently monitored for the following issues for this low-risk pregnancy and has MDD (major depressive disorder), recurrent severe, without psychosis (HCC); Wolff-Parkinson-White (WPW) syndrome; H/O cardiac radiofrequency ablation; Supervision of other normal pregnancy, antepartum; Abnormal antenatal test; and Group beta Strep positive on their problem list.  Patient reports no complaints.  Contractions: Irregular. Vag. Bleeding: None.  Movement: Present. Denies leaking of fluid.   The following portions of the patient's history were reviewed and updated as appropriate: allergies, current medications, past family history, past medical history, past social history, past surgical history and problem list. Problem list updated.  Objective:   Vitals:   03/17/18 0947  BP: 112/78  Pulse: 64  Weight: 134 lb 9.6 oz (61.1 kg)    Fetal Status: Fetal Heart Rate (bpm): 140   Movement: Present     General:  Alert, oriented and cooperative. Patient is in no acute distress.  Skin: Skin is warm and dry. No rash noted.   Cardiovascular: Normal heart rate noted  Respiratory: Normal respiratory effort, no problems with respiration noted  Abdomen: Soft, gravid, appropriate for gestational age. Pain/Pressure: Present     Pelvic:  Cervical exam deferred        Extremities: Normal range of motion.  Edema: None  Mental Status: Normal mood and affect. Normal behavior. Normal judgment and thought content.   Urinalysis:      Assessment and Plan:  Pregnancy: G1P0 at 5429w5d  1. Supervision of other normal pregnancy, antepartum  2. Abnormal antenatal test - XXY ( Klinefelter ) risk  3. Wolff-Parkinson-White (WPW) syndrome - stable  4. H/O cardiac radiofrequency ablation   Term labor symptoms and general obstetric precautions including but not limited to vaginal bleeding, contractions, leaking of  fluid and fetal movement were reviewed in detail with the patient. Please refer to After Visit Summary for other counseling recommendations.  Return in about 1 week (around 03/24/2018) for ROB.   Brock BadHarper, Iven Earnhart A, MD

## 2018-03-24 ENCOUNTER — Encounter: Payer: Self-pay | Admitting: Obstetrics

## 2018-03-24 ENCOUNTER — Ambulatory Visit (INDEPENDENT_AMBULATORY_CARE_PROVIDER_SITE_OTHER): Payer: Managed Care, Other (non HMO) | Admitting: Obstetrics

## 2018-03-24 VITALS — BP 123/77 | HR 54 | Wt 136.2 lb

## 2018-03-24 DIAGNOSIS — O289 Unspecified abnormal findings on antenatal screening of mother: Secondary | ICD-10-CM

## 2018-03-24 DIAGNOSIS — I456 Pre-excitation syndrome: Secondary | ICD-10-CM

## 2018-03-24 DIAGNOSIS — Z9889 Other specified postprocedural states: Secondary | ICD-10-CM

## 2018-03-24 DIAGNOSIS — Z3483 Encounter for supervision of other normal pregnancy, third trimester: Secondary | ICD-10-CM

## 2018-03-24 DIAGNOSIS — Z3480 Encounter for supervision of other normal pregnancy, unspecified trimester: Secondary | ICD-10-CM

## 2018-03-24 NOTE — Progress Notes (Signed)
Subjective:  Alyssa Munoz is a 22 y.o. G1P0 at [redacted]w[redacted]d being seen today for ongoing prenatal care.  She is currently monitored for the following issues for this low-risk pregnancy and has MDD (major depressive disorder), recurrent severe, without psychosis (HCC); Wolff-Parkinson-White (WPW) syndrome; H/O cardiac radiofrequency ablation; Supervision of other normal pregnancy, antepartum; Abnormal antenatal test; and Group beta Strep positive on their problem list.  Patient reports no complaints.  Contractions: Irregular. Vag. Bleeding: None.  Movement: Present. Denies leaking of fluid.   The following portions of the patient's history were reviewed and updated as appropriate: allergies, current medications, past family history, past medical history, past social history, past surgical history and problem list. Problem list updated.  Objective:   Vitals:   03/24/18 0939  BP: 123/77  Pulse: (!) 54  Weight: 136 lb 3.2 oz (61.8 kg)    Fetal Status: Fetal Heart Rate (bpm): 140   Movement: Present     General:  Alert, oriented and cooperative. Patient is in no acute distress.  Skin: Skin is warm and dry. No rash noted.   Cardiovascular: Normal heart rate noted  Respiratory: Normal respiratory effort, no problems with respiration noted  Abdomen: Soft, gravid, appropriate for gestational age. Pain/Pressure: Present     Pelvic:  Cvx:  Closed / 70% / -1 / Vtx  Extremities: Normal range of motion.  Edema: None  Mental Status: Normal mood and affect. Normal behavior. Normal judgment and thought content.   Urinalysis:      Assessment and Plan:  Pregnancy: G1P0 at [redacted]w[redacted]d  1. Supervision of other normal pregnancy, antepartum  2. Abnormal antenatal test  3. Wolff-Parkinson-White (WPW) syndrome  4. H/O cardiac radiofrequency ablation   Term labor symptoms and general obstetric precautions including but not limited to vaginal bleeding, contractions, leaking of fluid and fetal movement were  reviewed in detail with the patient. Please refer to After Visit Summary for other counseling recommendations.  Return in about 1 week (around 03/31/2018) for ROB.   Brock Bad, MD

## 2018-03-25 ENCOUNTER — Encounter (HOSPITAL_COMMUNITY): Payer: Self-pay | Admitting: *Deleted

## 2018-03-25 ENCOUNTER — Encounter (HOSPITAL_COMMUNITY): Payer: Self-pay

## 2018-03-25 ENCOUNTER — Inpatient Hospital Stay (HOSPITAL_COMMUNITY): Payer: Managed Care, Other (non HMO) | Admitting: Anesthesiology

## 2018-03-25 ENCOUNTER — Inpatient Hospital Stay (HOSPITAL_COMMUNITY)
Admission: AD | Admit: 2018-03-25 | Discharge: 2018-03-27 | DRG: 806 | Disposition: A | Discharge status: Home / Self Care | Payer: Managed Care, Other (non HMO) | Source: Ambulatory Visit | Attending: Obstetrics and Gynecology | Admitting: Obstetrics and Gynecology

## 2018-03-25 ENCOUNTER — Inpatient Hospital Stay (HOSPITAL_BASED_OUTPATIENT_CLINIC_OR_DEPARTMENT_OTHER): Payer: Managed Care, Other (non HMO)

## 2018-03-25 ENCOUNTER — Inpatient Hospital Stay (HOSPITAL_COMMUNITY)
Admission: AD | Admit: 2018-03-25 | Discharge: 2018-03-25 | Disposition: A | Discharge status: Home / Self Care | Payer: Managed Care, Other (non HMO) | Source: Ambulatory Visit | Attending: Obstetrics and Gynecology | Admitting: Obstetrics and Gynecology

## 2018-03-25 DIAGNOSIS — Z87891 Personal history of nicotine dependence: Secondary | ICD-10-CM

## 2018-03-25 DIAGNOSIS — O471 False labor at or after 37 completed weeks of gestation: Secondary | ICD-10-CM

## 2018-03-25 DIAGNOSIS — Z8679 Personal history of other diseases of the circulatory system: Secondary | ICD-10-CM | POA: Diagnosis not present

## 2018-03-25 DIAGNOSIS — Z3A39 39 weeks gestation of pregnancy: Secondary | ICD-10-CM

## 2018-03-25 DIAGNOSIS — O4693 Antepartum hemorrhage, unspecified, third trimester: Secondary | ICD-10-CM

## 2018-03-25 DIAGNOSIS — O99824 Streptococcus B carrier state complicating childbirth: Secondary | ICD-10-CM | POA: Diagnosis present

## 2018-03-25 HISTORY — DX: Anemia, unspecified: D64.9

## 2018-03-25 LAB — TYPE AND SCREEN
ABO/RH(D): O POS
Antibody Screen: NEGATIVE

## 2018-03-25 LAB — CBC
HCT: 33 % — ABNORMAL LOW (ref 36.0–46.0)
Hemoglobin: 11.5 g/dL — ABNORMAL LOW (ref 12.0–15.0)
MCH: 32.2 pg (ref 26.0–34.0)
MCHC: 34.8 g/dL (ref 30.0–36.0)
MCV: 92.4 fL (ref 78.0–100.0)
Platelets: 246 10*3/uL (ref 150–400)
RBC: 3.57 MIL/uL — ABNORMAL LOW (ref 3.87–5.11)
RDW: 13.2 % (ref 11.5–15.5)
WBC: 12 10*3/uL — ABNORMAL HIGH (ref 4.0–10.5)

## 2018-03-25 MED ORDER — LIDOCAINE HCL (PF) 1 % IJ SOLN
30.0000 mL | INTRAMUSCULAR | Status: DC | PRN
  Administered 2018-03-25: 30 mL via SUBCUTANEOUS
  Filled 2018-03-25: qty 30

## 2018-03-25 MED ORDER — MISOPROSTOL 200 MCG PO TABS
ORAL_TABLET | ORAL | Status: AC
  Administered 2018-03-25: 800 ug via BUCCAL
  Filled 2018-03-25: qty 4

## 2018-03-25 MED ORDER — PENICILLIN G 3 MILLION UNITS IVPB - SIMPLE MED
3.0000 10*6.[IU] | INTRAVENOUS | Status: DC
  Filled 2018-03-25 (×7): qty 100

## 2018-03-25 MED ORDER — SODIUM CHLORIDE 0.9 % IV SOLN
5.0000 10*6.[IU] | Freq: Once | INTRAVENOUS | Status: AC
  Administered 2018-03-25: 5 10*6.[IU] via INTRAVENOUS
  Filled 2018-03-25: qty 5

## 2018-03-25 MED ORDER — OXYCODONE-ACETAMINOPHEN 5-325 MG PO TABS: 2.0000 | ORAL_TABLET | ORAL | Status: DC | PRN

## 2018-03-25 MED ORDER — LACTATED RINGERS IV SOLN
INTRAVENOUS | Status: DC
  Administered 2018-03-25: 19:00:00 via INTRAVENOUS

## 2018-03-25 MED ORDER — DIPHENHYDRAMINE HCL 50 MG/ML IJ SOLN: 12.5000 mg | INTRAMUSCULAR | Status: DC | PRN

## 2018-03-25 MED ORDER — PHENYLEPHRINE 40 MCG/ML (10ML) SYRINGE FOR IV PUSH (FOR BLOOD PRESSURE SUPPORT)
80.0000 ug | PREFILLED_SYRINGE | INTRAVENOUS | Status: DC | PRN
  Filled 2018-03-25: qty 5

## 2018-03-25 MED ORDER — FENTANYL 2.5 MCG/ML BUPIVACAINE 1/10 % EPIDURAL INFUSION (WH - ANES)
INTRAMUSCULAR | Status: AC
  Filled 2018-03-25: qty 100

## 2018-03-25 MED ORDER — LACTATED RINGERS IV SOLN: 500.0000 mL | Freq: Once | INTRAVENOUS | Status: DC

## 2018-03-25 MED ORDER — EPHEDRINE 5 MG/ML INJ
10.0000 mg | INTRAVENOUS | Status: DC | PRN
  Filled 2018-03-25: qty 2

## 2018-03-25 MED ORDER — OXYTOCIN 40 UNITS IN LACTATED RINGERS INFUSION - SIMPLE MED
2.5000 [IU]/h | INTRAVENOUS | Status: DC
  Administered 2018-03-25: 2.5 [IU]/h via INTRAVENOUS
  Filled 2018-03-25: qty 1000

## 2018-03-25 MED ORDER — IBUPROFEN 600 MG PO TABS
600.0000 mg | ORAL_TABLET | Freq: Four times a day (QID) | ORAL | Status: DC
  Administered 2018-03-26 – 2018-03-27 (×7): 600 mg via ORAL
  Filled 2018-03-25 (×7): qty 1

## 2018-03-25 MED ORDER — OXYTOCIN BOLUS FROM INFUSION
500.0000 mL | Freq: Once | INTRAVENOUS | Status: AC
  Administered 2018-03-25: 500 mL via INTRAVENOUS

## 2018-03-25 MED ORDER — LIDOCAINE HCL (PF) 1 % IJ SOLN
INTRAMUSCULAR | Status: DC | PRN
  Administered 2018-03-25 (×2): 6 mL via EPIDURAL

## 2018-03-25 MED ORDER — MISOPROSTOL 200 MCG PO TABS
800.0000 ug | ORAL_TABLET | Freq: Once | ORAL | Status: AC
  Administered 2018-03-25: 800 ug via BUCCAL

## 2018-03-25 MED ORDER — PHENYLEPHRINE 40 MCG/ML (10ML) SYRINGE FOR IV PUSH (FOR BLOOD PRESSURE SUPPORT)
PREFILLED_SYRINGE | INTRAVENOUS | Status: AC
  Filled 2018-03-25: qty 10

## 2018-03-25 MED ORDER — FENTANYL 2.5 MCG/ML BUPIVACAINE 1/10 % EPIDURAL INFUSION (WH - ANES)
14.0000 mL/h | INTRAMUSCULAR | Status: DC | PRN
  Administered 2018-03-25: 14 mL/h via EPIDURAL

## 2018-03-25 MED ORDER — OXYCODONE-ACETAMINOPHEN 5-325 MG PO TABS: 1.0000 | ORAL_TABLET | ORAL | Status: DC | PRN

## 2018-03-25 MED ORDER — SOD CITRATE-CITRIC ACID 500-334 MG/5ML PO SOLN: 30.0000 mL | ORAL | Status: DC | PRN

## 2018-03-25 MED ORDER — ACETAMINOPHEN 325 MG PO TABS: 650.0000 mg | ORAL_TABLET | ORAL | Status: DC | PRN

## 2018-03-25 MED ORDER — LACTATED RINGERS IV SOLN: 500.0000 mL | INTRAVENOUS | Status: DC | PRN

## 2018-03-25 MED ORDER — ONDANSETRON HCL 4 MG/2ML IJ SOLN: 4.0000 mg | Freq: Four times a day (QID) | INTRAMUSCULAR | Status: DC | PRN

## 2018-03-25 NOTE — Anesthesia Procedure Notes (Signed)
Epidural Patient location during procedure: OB Start time: 03/25/2018 7:28 PM  Staffing Anesthesiologist: Leilani Able, MD Performed: anesthesiologist   Preanesthetic Checklist Completed: patient identified, site marked, surgical consent, pre-op evaluation, timeout performed, IV checked, risks and benefits discussed and monitors and equipment checked  Epidural Patient position: sitting Prep: site prepped and draped and DuraPrep Patient monitoring: continuous pulse ox and blood pressure Approach: midline Location: L3-L4 Injection technique: LOR air  Needle:  Needle type: Tuohy  Needle gauge: 17 G Needle length: 9 cm and 9 Needle insertion depth: 6 cm Catheter type: closed end flexible Catheter size: 19 Gauge Catheter at skin depth: 11 cm Test dose: negative and Other  Assessment Sensory level: T9 Events: blood not aspirated, injection not painful, no injection resistance, negative IV test and no paresthesia  Additional Notes Reason for block:procedure for pain

## 2018-03-25 NOTE — Anesthesia Pain Management Evaluation Note (Signed)
  CRNA Pain Management Visit Note  Patient: Alyssa Munoz, 22 y.o., female  "Hello I am a member of the anesthesia team at [facility]. We have an anesthesia team available at all times to provide care throughout the hospital, including epidural management and anesthesia for C-section. I don't know your plan for the delivery whether it a natural birth, water birth, IV sedation, nitrous supplementation, doula or epidural, but we want to meet your pain goals."   1.Was your pain managed to your expectations on prior hospitalizations?   No prior hospitalizations  2.What is your expectation for pain management during this hospitalization?     Epidural  3.How can we help you reach that goal? Epidural in place  Record the patient's initial score and the patient's pain goal.   Pain: 7  Pain Goal: 5 [facility] wants you to be able to say your pain was always managed very well.  Kolbe Delmonaco 03/25/2018

## 2018-03-25 NOTE — Progress Notes (Signed)
OB/GYN Faculty Practice: Labor Progress Note  Subjective: Called to room feeling pressure.   Objective: BP (!) 143/82   Pulse 60   Temp 98.4 F (36.9 C)   Resp 18   LMP 06/19/2017   SpO2 99%  Gen: uncomfortable appearing, lying on side, family in room Dilation: 8 Effacement (%): 100 Cervical Position: Middle Station: Plus 1 Presentation: Vertex Exam by:: Hermenia Bers, RN  Assessment and Plan: 22 y.o. G1P0 [redacted]w[redacted]d here for spontaneous onset of labor. Pregnancy complicated by WPW Syndrome (s/p ablation 2011), risk of XXY Klinefelter syndrome on early genetic screening.   Labor: Active, nearly complete. Came into MAU with bleeding, continuing to have bleeding on pads but no abdominal tenderness. No augmentation to this point, labor curve reviewed. Feeling urge to push, anterior lip. Will defer AROM given inadequately treated GBS. Mother breathing through contractions.  -- pain control: epidural in place  Fetal Well-Being: EFW 6-7lbs by Le[place]olds. Cephalic by sutures.  -- Category I - continuous fetal monitoring  -- GBS (+) - pcn 1715   Yates Weisgerber S. Earlene Plater, DO OB/GYN Fellow, Faculty Practice  9:42 PM

## 2018-03-25 NOTE — H&P (Signed)
Alyssa Munoz is a 22 y.o. female G1P0 @[redacted]w[redacted]d  with maternal Wolf-Parkinson-White syndrome post ablation in 2011, cleared for vaginal delivery by cardiology and fetal XXY Klinefelter on Panorama presenting for labor evaluation and heavy vaginal bleeding. Pt reports contractions have worsened since her MAU visit this morning and bleeding started.  She has soaked underwear with bright red bleeding and it is trickling down her legs.  She is feeling good fetal movement.    Clinic  [facility]  Prenatal Labs  Dating LMP/18 Blood type: O/Positive/-- (03/01 0949)   Genetic Screen AFP: Normal  NIPS: suggestive of XXY, needs genetic counseling [x]  Antibody:Negative (03/01 1610)  Anatomic [place] Normal female, but incomplete anatomy. F/U in 3-4 weeks Rubella: 3.74 (03/01 0949)  GTT  Third trimester: WNL RPR: Non Reactive (06/17 1100)   Flu vaccine Declined 09-17-17 HBsAg: Negative (03/01 0949)   TDaP vaccine Declined 02/16/18                                         HIV: Non Reactive (06/17 1100)  Baby Food Breast/bottle                                          RUE:[id number] (08/16 1123)(For PCN allergy, check sensitivities)  Contraception Pill Pap: 09/17/17, NIL   Circumcision Yes; in office   Pediatrician Not yet list given 03/10/18 CF: neg for 97 mutations   Support Person fob SMA:3 copies Reduced risk   Prenatal Classes no Hgb electrophoresis:Nml AA     OB History    Gravida  1   Para      Term      Preterm      AB      Living        SAB      TAB      Ectopic      Multiple      Live Births             Past Medical History:  Diagnosis Date  . Anemia   . Anxiety   . Chronic chest wall pain 2013  . Depression   . History of Wolff-Parkinson-White (WPW) syndrome    s/p ablation 2011 [facility] (Peds Cards Dr. Cristy Folks)  . Panic attack    Past Surgical History:  Procedure Laterality Date  . CARDIAC ELECTROPHYSIOLOGY STUDY AND ABLATION     wolffe parkinson syndrome   Family History:  family history includes Diabetes in her maternal grandmother; Lung cancer in her maternal grandfather. Social History:  reports that she quit smoking about 7 months ago. Her smoking use included cigarettes. She smoked 0.25 packs per day. She has never used smokeless tobacco. She reports that she has current or past drug history. Drug: Marijuana. She reports that she does not drink alcohol.     Maternal Diabetes: No Genetic Screening: Abnormal:  Results: Other: Maternal Ultrasounds/Referrals: Normal Fetal Ultrasounds or other Referrals:  None Maternal Substance Abuse:  No Significant Maternal Medications:  None Significant Maternal Lab Results:  Lab values include: Group B Strep positive Other Comments:  Panorama with XXY risk, declined amnio  Review of Systems  Constitutional: Negative for chills, fever and malaise/fatigue.  Eyes: Negative for blurred vision.  Respiratory: Negative for cough and shortness of breath.   Cardiovascular:  Negative for chest pain.  Gastrointestinal: Negative for heartburn and vomiting.  Genitourinary: Negative for dysuria, frequency and urgency.  Musculoskeletal: Negative.   Neurological: Negative for dizziness and headaches.  Psychiatric/Behavioral: Negative for depression.   Maternal Medical History:  Reason for admission: Contractions and vaginal bleeding.   Contractions: Onset was 6-12 hours ago.   Frequency: regular.   Perceived severity is moderate.    Fetal activity: Perceived fetal activity is normal.   Last perceived fetal movement was within the past hour.    Prenatal complications: Mom with cardiac history, cleared for delivery by cardiology, baby with abnormal NIPS with XXY risk   Prenatal Complications - Diabetes: none.    Dilation: 5 Effacement (%): 100 Station: -2 Exam by:: L. Leftwich-Kirby CNM Blood pressure (!) 116/59, pulse (!) 55, temperature 98.4 F (36.9 C), last menstrual period 06/19/2017, SpO2 97 %. Maternal Exam:   Uterine Assessment: Contraction strength is mild.  Contraction frequency is regular.   Abdomen: Fetal presentation: vertex  Introitus: Bright red vaginal bleeding requiring a pad      Fetal Exam Fetal Monitor Review: Mode: ultrasound.   Baseline rate: 125.  Variability: moderate (6-25 bpm).   Pattern: accelerations present and no decelerations.    Fetal State Assessment: Category I - tracings are normal.     Physical Exam  Nursing note and vitals reviewed. Constitutional: She is oriented to person, place, and time. She appears well-developed and well-nourished.  Neck: Normal range of motion.  Cardiovascular: Normal rate and regular rhythm.  Respiratory: Effort normal and breath sounds normal.  GI: Soft.  Musculoskeletal: Normal range of motion.  Neurological: She is alert and oriented to person, place, and time.  Skin: Skin is warm and dry.  Psychiatric: She has a normal mood and affect. Her behavior is normal. Judgment and thought content normal.    Prenatal labs: ABO, Rh: O/Positive/-- (03/01 0949) Antibody: Negative (03/01 0949) Rubella: 3.74 (03/01 0949) RPR: Non Reactive (06/17 1100)  HBsAg: Negative (03/01 0949)  HIV: Non Reactive (06/17 1100)  GBS: Positive (08/16 1123)   Assessment/Plan: 22 y.o. G1P1001 @ [redacted]w[redacted]d admitted for early labor with vaginal bleeding at term  Admit to [facility] Expectant management, consider augmentation if needed PCN for GBS prophylaxis Anticipate NSVD  Sharen Counter 03/25/2018, 6:41 PM

## 2018-03-25 NOTE — Anesthesia Preprocedure Evaluation (Signed)
Anesthesia Evaluation  Patient identified by MRN, date of birth, ID band Patient awake    Reviewed: Allergy & Precautions, H&P , NPO status , Patient's Chart, lab work & pertinent test results  Airway Mallampati: I  TM Distance: >3 FB Neck ROM: full    Dental no notable dental hx. (+) Teeth Intact   Pulmonary former smoker,    Pulmonary exam normal breath sounds clear to auscultation       Cardiovascular negative cardio ROS Normal cardiovascular exam Rhythm:regular Rate:Normal     Neuro/Psych negative neurological ROS     GI/Hepatic negative GI ROS, Neg liver ROS,   Endo/Other  negative endocrine ROS  Renal/GU negative Renal ROS     Musculoskeletal   Abdominal Normal abdominal exam  (+)   Peds  Hematology  (+) anemia ,   Anesthesia Other Findings   Reproductive/Obstetrics (+) Pregnancy                             Anesthesia Physical Anesthesia Plan  ASA: II  Anesthesia Plan: Epidural   Post-op Pain Management:    Induction:   PONV Risk Score and Plan:   Airway Management Planned:   Additional Equipment:   Intra-op Plan:   Post-operative Plan:   Informed Consent: I have reviewed the patients History and Physical, chart, labs and discussed the procedure including the risks, benefits and alternatives for the proposed anesthesia with the patient or authorized representative who has indicated his/her understanding and acceptance.     Plan Discussed with:   Anesthesia Plan Comments:         Anesthesia Quick Evaluation

## 2018-03-25 NOTE — MAU Note (Signed)
Pt presents via EMS with worsening contractions and bleeding

## 2018-03-25 NOTE — MAU Note (Signed)
Pt reports contractions and back pain, denies bleeding or ROM

## 2018-03-25 NOTE — Discharge Instructions (Signed)
Braxton Hicks Contractions °Contractions of the uterus can occur throughout pregnancy, but they are not always a sign that you are in labor. You may have practice contractions called Braxton Hicks contractions. These false labor contractions are sometimes confused with true labor. °What are Braxton Hicks contractions? °Braxton Hicks contractions are tightening movements that occur in the muscles of the uterus before labor. Unlike true labor contractions, these contractions do not result in opening (dilation) and thinning of the cervix. Toward the end of pregnancy (32-34 weeks), Braxton Hicks contractions can happen more often and may become stronger. These contractions are sometimes difficult to tell apart from true labor because they can be very uncomfortable. You should not feel embarrassed if you go to the hospital with false labor. °Sometimes, the only way to tell if you are in true labor is for your health care provider to look for changes in the cervix. The health care provider will do a physical exam and may monitor your contractions. If you are not in true labor, the exam should show that your cervix is not dilating and your water has not broken. °If there are other health problems associated with your pregnancy, it is completely safe for you to be sent home with false labor. You may continue to have Braxton Hicks contractions until you go into true labor. °How to tell the difference between true labor and false labor °True labor °· Contractions last 30-70 seconds. °· Contractions become very regular. °· Discomfort is usually felt in the top of the uterus, and it spreads to the lower abdomen and low back. °· Contractions do not go away with walking. °· Contractions usually become more intense and increase in frequency. °· The cervix dilates and gets thinner. °False labor °· Contractions are usually shorter and not as strong as true labor contractions. °· Contractions are usually irregular. °· Contractions  are often felt in the front of the lower abdomen and in the groin. °· Contractions may go away when you walk around or change positions while lying down. °· Contractions get weaker and are shorter-lasting as time goes on. °· The cervix usually does not dilate or become thin. °Follow these instructions at home: °· Take over-the-counter and prescription medicines only as told by your health care provider. °· Keep up with your usual exercises and follow other instructions from your health care provider. °· Eat and drink lightly if you think you are going into labor. °· If Braxton Hicks contractions are making you uncomfortable: °? Change your position from lying down or resting to walking, or change from walking to resting. °? Sit and rest in a tub of warm water. °? Drink enough fluid to keep your urine pale yellow. Dehydration may cause these contractions. °? Do slow and deep breathing several times an hour. °· Keep all follow-up prenatal visits as told by your health care provider. This is important. °Contact a health care provider if: °· You have a fever. °· You have continuous pain in your abdomen. °Get help right away if: °· Your contractions become stronger, more regular, and closer together. °· You have fluid leaking or gushing from your vagina. °· You pass blood-tinged mucus (bloody show). °· You have bleeding from your vagina. °· You have low back pain that you never had before. °· You feel your baby’s head pushing down and causing pelvic pressure. °· Your baby is not moving inside you as much as it used to. °Summary °· Contractions that occur before labor are called Braxton   Hicks contractions, false labor, or practice contractions. °· Braxton Hicks contractions are usually shorter, weaker, farther apart, and less regular than true labor contractions. True labor contractions usually become progressively stronger and regular and they become more frequent. °· Manage discomfort from Braxton Hicks contractions by  changing position, resting in a warm bath, drinking plenty of water, or practicing deep breathing. °This information is not intended to replace advice given to you by your health care provider. Make sure you discuss any questions you have with your health care provider. °Document Released: 11/19/2016 Document Revised: 11/19/2016 Document Reviewed: 11/19/2016 °Elsevier Interactive Patient Education © 2018 [employer] ° °Fetal Movement Counts °Patient Name: ________________________________________________ Patient Due Date: ____________________ °What is a fetal movement count? °A fetal movement count is the number of times that you feel your baby move during a certain amount of time. This may also be called a fetal kick count. A fetal movement count is recommended for every pregnant woman. You may be asked to start counting fetal movements as early as week 28 of your pregnancy. °Pay attention to when your baby is most active. You may notice your baby's sleep and wake cycles. You may also notice things that make your baby move more. You should do a fetal movement count: °· When your baby is normally most active. °· At the same time each day. ° °A good time to count movements is while you are resting, after having something to eat and drink. °How do I count fetal movements? °1. Find a quiet, comfortable area. Sit, or lie down on your side. °2. Write down the date, the start time and stop time, and the number of movements that you felt between those two times. Take this information with you to your health care visits. °3. For 2 hours, count kicks, flutters, swishes, rolls, and jabs. You should feel at least 10 movements during 2 hours. °4. You may stop counting after you have felt 10 movements. °5. If you do not feel 10 movements in 2 hours, have something to eat and drink. Then, keep resting and counting for 1 hour. If you feel at least 4 movements during that hour, you may stop counting. °Contact a health care  provider if: °· You feel fewer than 4 movements in 2 hours. °· Your baby is not moving like he or she usually does. °Date: ____________ Start time: ____________ Stop time: ____________ Movements: ____________ °Date: ____________ Start time: ____________ Stop time: ____________ Movements: ____________ °Date: ____________ Start time: ____________ Stop time: ____________ Movements: ____________ °Date: ____________ Start time: ____________ Stop time: ____________ Movements: ____________ °Date: ____________ Start time: ____________ Stop time: ____________ Movements: ____________ °Date: ____________ Start time: ____________ Stop time: ____________ Movements: ____________ °Date: ____________ Start time: ____________ Stop time: ____________ Movements: ____________ °Date: ____________ Start time: ____________ Stop time: ____________ Movements: ____________ °Date: ____________ Start time: ____________ Stop time: ____________ Movements: ____________ °This information is not intended to replace advice given to you by your health care provider. Make sure you discuss any questions you have with your health care provider. °Document Released: 08/05/2006 Document Revised: 03/04/2016 Document Reviewed: 08/15/2015 °Elsevier Interactive Patient Education © 2018 Elsevier Inc. ° °

## 2018-03-26 LAB — ABO/RH: ABO/RH(D): O POS

## 2018-03-26 LAB — RPR: RPR Ser Ql: NONREACTIVE

## 2018-03-26 MED ORDER — WITCH HAZEL-GLYCERIN EX PADS: 1.0000 "application " | MEDICATED_PAD | CUTANEOUS | Status: DC | PRN

## 2018-03-26 MED ORDER — DIBUCAINE 1 % RE OINT: 1.0000 "application " | TOPICAL_OINTMENT | RECTAL | Status: DC | PRN

## 2018-03-26 MED ORDER — ACETAMINOPHEN 325 MG PO TABS
650.0000 mg | ORAL_TABLET | ORAL | Status: DC | PRN
  Administered 2018-03-26: 650 mg via ORAL
  Filled 2018-03-26: qty 2

## 2018-03-26 MED ORDER — ONDANSETRON HCL 4 MG PO TABS: 4.0000 mg | ORAL_TABLET | ORAL | Status: DC | PRN

## 2018-03-26 MED ORDER — DIPHENHYDRAMINE HCL 25 MG PO CAPS: 25.0000 mg | ORAL_CAPSULE | Freq: Four times a day (QID) | ORAL | Status: DC | PRN

## 2018-03-26 MED ORDER — OXYCODONE-ACETAMINOPHEN 5-325 MG PO TABS: 1.0000 | ORAL_TABLET | ORAL | Status: DC | PRN

## 2018-03-26 MED ORDER — SIMETHICONE 80 MG PO CHEW: 80.0000 mg | CHEWABLE_TABLET | ORAL | Status: DC | PRN

## 2018-03-26 MED ORDER — ZOLPIDEM TARTRATE 5 MG PO TABS: 5.0000 mg | ORAL_TABLET | Freq: Every evening | ORAL | Status: DC | PRN

## 2018-03-26 MED ORDER — SENNOSIDES-DOCUSATE SODIUM 8.6-50 MG PO TABS
2.0000 | ORAL_TABLET | ORAL | Status: DC
  Administered 2018-03-26 – 2018-03-27 (×2): 2 via ORAL
  Filled 2018-03-26 (×2): qty 2

## 2018-03-26 MED ORDER — PRENATAL MULTIVITAMIN CH
1.0000 | ORAL_TABLET | Freq: Every day | ORAL | Status: DC
  Administered 2018-03-26 – 2018-03-27 (×2): 1 via ORAL
  Filled 2018-03-26 (×2): qty 1

## 2018-03-26 MED ORDER — OXYCODONE-ACETAMINOPHEN 5-325 MG PO TABS: 2.0000 | ORAL_TABLET | ORAL | Status: DC | PRN

## 2018-03-26 MED ORDER — TETANUS-DIPHTH-ACELL PERTUSSIS 5-2.5-18.5 LF-MCG/0.5 IM SUSP: 0.5000 mL | Freq: Once | INTRAMUSCULAR | Status: DC

## 2018-03-26 MED ORDER — ONDANSETRON HCL 4 MG/2ML IJ SOLN: 4.0000 mg | INTRAMUSCULAR | Status: DC | PRN

## 2018-03-26 MED ORDER — BENZOCAINE-MENTHOL 20-0.5 % EX AERO: 1.0000 "application " | INHALATION_SPRAY | CUTANEOUS | Status: DC | PRN

## 2018-03-26 MED ORDER — COCONUT OIL OIL: 1.0000 "application " | TOPICAL_OIL | Status: DC | PRN

## 2018-03-26 NOTE — Progress Notes (Signed)
Post Partum Day 1 Subjective: Doing well. Much more comfortable than overnight, looking forward to getting breakfast. Thinks breastfeeding is going well.  Objective: Blood pressure 109/73, pulse 71, temperature 98.2 F (36.8 C), temperature source Oral, resp. rate 18, last menstrual period 06/19/2017, SpO2 99 %, unknown if currently breastfeeding.  Physical Exam:  General: sitting up in bed breastfeeding, smiling and NAD Lochia: appropriate, no additional clots passed Uterine Fundus: firm Incision: n/a DVT Evaluation: No evidence of DVT seen on physical exam.  Recent Labs    03/25/18 1840  HGB 11.5*  HCT 33.0*    Assessment/Plan: Discharge tomorrow Continue breastfeeding support Routine postpartum care with perineal comforts, ibuprofen   Alyssa Basil S. Earlene Plater, DO OB/GYN Fellow

## 2018-03-26 NOTE — Significant Event (Signed)
Call from Nursing w/ concerns about some past partum bleeding. W/ fundal massage expressed  Golf ball sized clot and approx 100cc blood expressed. Uterus was firm per nursing. Advised to give 800 cytotec buccal. Approx 1 hr later, bleeding has resolved.

## 2018-03-26 NOTE — Progress Notes (Signed)
CSW received consult for [name] due to her history of depression. CSW met with [name] and baby Alyssa Munoz to complete assessment. [name] reports she was diagnosed with depression back in 2016 due to a relationship break-up. [name] denies any reoccurring symptoms as time as moved on. [name] confirms a brief stay at [facility] during that time where she received therapy treatment and medication. [name] denies current the need for psychotropic medications. This is [name]'s first child. CSW educated [name] on baby blues period versus postpartum depression. [name] reports having a great support system with FOB and family. CSW encouraged [name] to reach out for assistance if needs or questions arise, [name] agreeable.  Alyssa Munoz, MSW, [place] Social Worker [facility] [phone]

## 2018-03-26 NOTE — Anesthesia Postprocedure Evaluation (Signed)
Anesthesia Post Note  Patient: Alyssa Munoz  Procedure(s) Performed: AN AD HOC LABOR EPIDURAL     Patient location during evaluation: Mother Baby Anesthesia Type: Epidural Level of consciousness: awake and alert, oriented and patient cooperative Pain management: pain level controlled Vital Signs Assessment: post-procedure vital signs reviewed and stable Respiratory status: spontaneous breathing Cardiovascular status: stable Postop Assessment: no headache, epidural receding, patient able to bend at knees and no signs of nausea or vomiting Anesthetic complications: no Comments: Pain score 0.    Last Vitals:  Vitals:   03/26/18 0146 03/26/18 0543  BP: (!) 141/86 113/66  Pulse: (!) 59 62  Resp: 20 20  Temp: 36.8 C 37.3 C  SpO2:      Last Pain:  Vitals:   03/26/18 0544  TempSrc:   PainSc: 0-No pain   Pain Goal:                 [facility]

## 2018-03-26 NOTE — Lactation Note (Signed)
This note was copied from a baby's chart. Lactation Consultation Note  Patient Name: Alyssa Munoz [id number] Date: 03/26/2018 Reason for consult: Initial assessment;Primapara;1st time breastfeeding;Term  Visited with P1 term baby at 17 hrs old.  Mom and GMOB concerned about baby not having a bath yet, lunch not coming up before her guests arrive. Baby has already been given formula by bottle X 4, but he isn't interested in eating much 2-5 ml.   Mom concerned she doesn't have her milk in yet. Talked about size of baby's stomach, colostrum being early breast milk and suited exactly for baby. Talked about importance of keeping baby STS on her chest, rather than swaddled being held by [facility].   Reassured her that many term baby's don't get hungry for quite a few hours.  Mom taught how to hand express and do breast massage.  Talked about offering her colostrum by spoon if baby continues to be sleepy at the breast.   Mom asked about taking a shower.  Instructed to call RN when baby starts cueing for next feeding. Encouraged her to place baby STS on her chest.  Goal of >8 feedings per 24 hrs. Lactation brochure given to Mom.  Mom aware of IP and OP lactation services.    Consult Status Consult Status: Follow-up Date: 03/27/18 Follow-up type: In-patient    Judee Clara 03/26/2018, 3:07 PM

## 2018-03-27 ENCOUNTER — Other Ambulatory Visit: Payer: Self-pay

## 2018-03-27 MED ORDER — IBUPROFEN 600 MG PO TABS: 600.0000 mg | ORAL_TABLET | Freq: Four times a day (QID) | ORAL | 0 refills | Status: DC | PRN

## 2018-03-27 MED ORDER — ACETAMINOPHEN 325 MG PO TABS: 650.0000 mg | ORAL_TABLET | ORAL | Status: DC | PRN

## 2018-03-27 NOTE — Discharge Summary (Signed)
Error

## 2018-03-27 NOTE — Discharge Summary (Signed)
Obstetrics Discharge Summary OB/GYN Faculty Practice   Patient Name: Alyssa Munoz DOB: 03/04/1996 MRN: [id number]  Date of admission: 03/25/2018 Delivering MD: Tamera Stands   Date of discharge: 03/27/2018  Admitting diagnosis: EMS Intrauterine pregnancy: [redacted]w[redacted]d     Secondary diagnosis:   Active Problems:   Labor and delivery, indication for care   Additional problems:  . [employer] s/p ablation . Fetal XXY     Discharge diagnosis: Term Pregnancy Delivered                                            Postpartum procedures: None  Complications: none  Hospital course: Alyssa Munoz is a 22 y.o. [redacted]w[redacted]d who was admitted for SOL, vaginal bleeding. Her pregnancy was complicated by fetal XXY. Her labor course was uncomplicated. Delivery was uncomplicated. Please see delivery/op note for additional details. Her postpartum course was complicated by PPH which resolved w/ 800 cytotec. She was breastfeeding without difficulty. By day of discharge, she was passing flatus, urinating, eating and drinking without difficulty. Her pain was well-controlled, and she was discharged home with tylenol, ibuprofen. She will follow-up in clinic in 4 weeks.   Physical exam  Vitals:   03/26/18 0850 03/26/18 1620 03/26/18 2154 03/27/18 0604  BP: 109/73 114/79 124/81 124/86  Pulse: 71 69 (!) 52 (!) 50  Resp: 18 18 18 18   Temp: 98.2 F (36.8 C) 98.2 F (36.8 C) 98.3 F (36.8 C) 98 F (36.7 C)  TempSrc: Oral Oral Oral Oral  SpO2:       General: AAOx3, NAD. Lochia: appropriate Uterine Fundus: firm Incision: N/A DVT Evaluation: No evidence of DVT seen on physical exam. Labs: Lab Results  Component Value Date   WBC 12.0 (H) 03/25/2018   HGB 11.5 (L) 03/25/2018   HCT 33.0 (L) 03/25/2018   MCV 92.4 03/25/2018   PLT 246 03/25/2018   CMP Latest Ref Rng & Units 08/15/2014  Glucose 70 - 99 mg/dL 98  BUN 6 - 23 mg/dL 7  Creatinine 1.19 - 1.47 mg/dL 8.29  Sodium 562 - 130 mmol/L 140   Potassium 3.5 - 5.1 mmol/L 3.8  Chloride 96 - 112 mmol/L 105  CO2 19 - 32 mmol/L -  Calcium 8.4 - 10.5 mg/dL -    Discharge instructions: Per After Visit Summary and "Baby and Me Booklet"  After visit meds:  Allergies as of 03/27/2018      Reactions   Latex Hives, Itching      Medication List    STOP taking these medications   CONCEPT OB 130-92.4-1 MG Caps   FUSION PLUS Caps     TAKE these medications   acetaminophen 325 MG tablet Commonly known as:  TYLENOL Take 2 tablets (650 mg total) by mouth every 4 (four) hours as needed (for pain scale < 4).   ibuprofen 600 MG tablet Commonly known as:  ADVIL,MOTRIN Take 1 tablet (600 mg total) by mouth every 6 (six) hours as needed.       Postpartum contraception: Combination OCPs Diet: Routine Diet Activity: Advance as tolerated. Pelvic rest for 6 weeks.   Outpatient follow up:4 wks Follow-up Appt: Future Appointments  Date Time Provider Department Center  03/31/2018 10:00 AM Brock Bad, MD [facility] None   Follow-up Visit:No follow-ups on file.  Newborn Data: Live born female  Birth Weight: 6 lb 11.9 oz (  3059 g) APGAR: 8, 9  Newborn Delivery   Birth date/time:  03/25/2018 21:58:00 Delivery type:  Vaginal, Spontaneous     Baby Feeding: Breast Disposition:home with mother

## 2018-03-27 NOTE — Discharge Instructions (Signed)
Vaginal Delivery, Care After Refer to this sheet in the next few weeks. These discharge instructions provide you with information on caring for yourself after delivery. Your caregiver may also give you specific instructions. Your treatment has been planned according to the most current medical practices available, but problems sometimes occur. Call your caregiver if you have any problems or questions after you go home. HOME CARE INSTRUCTIONS 1. Take over-the-counter or prescription medicines only as directed by your caregiver or pharmacist. 2. Do not drink alcohol, especially if you are breastfeeding or taking medicine to relieve pain. 3. Do not smoke tobacco. 4. Continue to use good perineal care. Good perineal care includes: 1. Wiping your perineum from back to front 2. Keeping your perineum clean. 3. You can do sitz baths twice a day, to help keep this area clean 5. Do not use tampons, douche or have sex until your caregiver says it is okay. 6. Shower only and avoid sitting in submerged water, aside from sitz baths 7. Wear a well-fitting bra that provides breast support. 8. Eat healthy foods. 9. Drink enough fluids to keep your urine clear or pale yellow. 10. Eat high-fiber foods such as whole grain cereals and breads, brown rice, beans, and fresh fruits and vegetables every day. These foods may help prevent or relieve constipation. 11. Avoid constipation with high fiber foods or medications, such as miralax or metamucil 12. Follow your caregiver's recommendations regarding resumption of activities such as climbing stairs, driving, lifting, exercising, or traveling. 13. Talk to your caregiver about resuming sexual activities. Resumption of sexual activities is dependent upon your risk of infection, your rate of healing, and your comfort and desire to resume sexual activity. 14. Try to have someone help you with your household activities and your newborn for at least a few days after you leave  the hospital. 15. Rest as much as possible. Try to rest or take a nap when your newborn is sleeping. 16. Increase your activities gradually. 17. Keep all of your scheduled postpartum appointments. It is very important to keep your scheduled follow-up appointments. At these appointments, your caregiver will be checking to make sure that you are healing physically and emotionally. SEEK MEDICAL CARE IF:   You are passing large clots from your vagina. Save any clots to show your caregiver.  You have a foul smelling discharge from your vagina.  You have trouble urinating.  You are urinating frequently.  You have pain when you urinate.  You have a change in your bowel movements.  You have increasing redness, pain, or swelling near your vaginal incision (episiotomy) or vaginal tear.  You have pus draining from your episiotomy or vaginal tear.  Your episiotomy or vaginal tear is separating.  You have painful, hard, or reddened breasts.  You have a severe headache.  You have blurred vision or see spots.  You feel sad or depressed.  You have thoughts of hurting yourself or your newborn.  You have questions about your care, the care of your newborn, or medicines.  You are dizzy or light-headed.  You have a rash.  You have nausea or vomiting.  You were breastfeeding and have not had a menstrual period within 12 weeks after you stopped breastfeeding.  You are not breastfeeding and have not had a menstrual period by the 12th week after delivery.  You have a fever. SEEK IMMEDIATE MEDICAL CARE IF:   You have persistent pain.  You have chest pain.  You have shortness of breath.    You faint.  You have leg pain.  You have stomach pain.  Your vaginal bleeding saturates two or more sanitary pads in 1 hour. MAKE SURE YOU:   Understand these instructions.  Will watch your condition.  Will get help right away if you are not doing well or get worse. Document Released:  07/03/2000 Document Revised: 11/20/2013 Document Reviewed: 03/02/2012 ExitCare Patient Information 2015 [employer]. This information is not intended to replace advice given to you by your health care provider. Make sure you discuss any questions you have with your health care provider.  Sitz Bath A sitz bath is a warm water bath taken in the sitting position. The water covers only the hips and butt (buttocks). We recommend using one that fits in the toilet, to help with ease of use and cleanliness. It may be used for either healing or cleaning purposes. Sitz baths are also used to relieve pain, itching, or muscle tightening (spasms). The water may contain medicine. Moist heat will help you heal and relax.  HOME CARE  Take 3 to 4 sitz baths a day. 18. Fill the bathtub half-full with warm water. 19. Sit in the water and open the drain a little. 20. Turn on the warm water to keep the tub half-full. Keep the water running constantly. 21. Soak in the water for 15 to 20 minutes. 22. After the sitz bath, pat the affected area dry. GET HELP RIGHT AWAY IF: You get worse instead of better. Stop the sitz baths if you get worse. MAKE SURE YOU:  Understand these instructions.  Will watch your condition.  Will get help right away if you are not doing well or get worse. Document Released: 08/13/2004 Document Revised: 03/30/2012 Document Reviewed: 11/03/2010 ExitCare Patient Information 2015 [employer]. This information is not intended to replace advice given to you by your health care provider. Make sure you discuss any questions you have with your health care provider.    

## 2018-03-28 ENCOUNTER — Ambulatory Visit: Payer: Self-pay

## 2018-03-28 NOTE — Lactation Note (Signed)
This note was copied from a baby's chart. Lactation Consultation Note  Patient Name: Alyssa Munoz JSEGB'[id number] Date: 03/28/2018 Reason for consult: Follow-up assessment Mom is mostly formula feeding because she doesn't know how much baby gets at the breast.  Discussed supply and demand and milk coming to volume.  Instructed on prevention and treatment of engorgement.  Instructed to put baby to breast each feeding before offering formula.  Encouraged to call out for assist.  Lactation outpatient services and support information reviewed.  Maternal Data    Feeding    LATCH Score                   Interventions    Lactation Tools Discussed/Used     Consult Status Consult Status: Complete Follow-up type: Call as needed    Huston Foley 03/28/2018, 9:18 AM

## 2018-03-31 ENCOUNTER — Encounter: Payer: Self-pay | Admitting: Obstetrics

## 2018-04-02 ENCOUNTER — Inpatient Hospital Stay (HOSPITAL_COMMUNITY)
Admission: RE | Admit: 2018-04-02 | Discharge: 2018-04-02 | Disposition: A | Discharge status: Home / Self Care | Payer: Managed Care, Other (non HMO) | Source: Ambulatory Visit | Attending: Obstetrics & Gynecology | Admitting: Obstetrics & Gynecology

## 2018-04-27 ENCOUNTER — Ambulatory Visit: Payer: Managed Care, Other (non HMO) | Admitting: Internal Medicine

## 2018-05-03 ENCOUNTER — Ambulatory Visit: Payer: Self-pay | Admitting: Obstetrics

## 2018-05-04 ENCOUNTER — Telehealth: Payer: Self-pay | Admitting: Obstetrics

## 2018-05-04 NOTE — Telephone Encounter (Signed)
Called pt left msg to ret call to reschedule missed postpartum visit 10/9 an 10/15. Please schedule postpartum appt upon return call. °

## 2018-05-18 ENCOUNTER — Encounter: Payer: Self-pay | Admitting: Obstetrics

## 2018-05-18 ENCOUNTER — Ambulatory Visit (INDEPENDENT_AMBULATORY_CARE_PROVIDER_SITE_OTHER): Payer: Managed Care, Other (non HMO) | Admitting: Obstetrics

## 2018-05-18 DIAGNOSIS — Z1389 Encounter for screening for other disorder: Secondary | ICD-10-CM

## 2018-05-18 DIAGNOSIS — Z30011 Encounter for initial prescription of contraceptive pills: Secondary | ICD-10-CM

## 2018-05-18 DIAGNOSIS — Z3202 Encounter for pregnancy test, result negative: Secondary | ICD-10-CM

## 2018-05-18 DIAGNOSIS — Z3009 Encounter for other general counseling and advice on contraception: Secondary | ICD-10-CM

## 2018-05-18 LAB — POCT URINE PREGNANCY: Preg Test, Ur: NEGATIVE

## 2018-05-18 MED ORDER — NORETHIN ACE-ETH ESTRAD-FE 1-20 MG-MCG(24) PO TABS: 1.0000 | ORAL_TABLET | Freq: Every day | ORAL | 11 refills | Status: DC

## 2018-05-18 NOTE — Progress Notes (Signed)
Pt is interested in [facility] pills.   [name].Post Partum Exam  Alyssa Munoz is a 22 y.o. G40P1001 female who presents for a postpartum visit. She is 8 weeks postpartum following a spontaneous vaginal delivery. I have fully reviewed the prenatal and intrapartum course. The delivery was at 39.6 gestational weeks.  Anesthesia: epidural. Postpartum course has been good. Baby's course has been good. Baby is feeding by bottle - [name] Start. Bleeding no bleeding. Bowel function is normal. Bladder function is normal. Patient is sexually active. Contraception method is none. Postpartum depression screening:neg   The following portions of the patient's history were reviewed and updated as appropriate: allergies, current medications, past family history, past medical history, past social history, past surgical history and problem list. Last pap smear done 2019 and was Normal  Review of Systems A comprehensive review of systems was negative.    Objective:  Last menstrual period 06/19/2017, unknown if currently breastfeeding.  General:  alert and no distress   Breasts:  inspection negative, no nipple discharge or bleeding, no masses or nodularity palpable  Lungs: clear to auscultation bilaterally  Heart:  regular rate and rhythm, S1, S2 normal, no murmur, click, rub or gallop   Assessment:    1. Postpartum care following vaginal delivery  2. Encounter for other general counseling and advice on contraception - wants OCP's  3. Encounter for initial prescription of contraceptive pills Rx: - POCT urine pregnancy - Norethindrone Acetate-Ethinyl Estrad-FE (LOESTRIN 24 FE) 1-20 MG-MCG(24) tablet; Take 1 tablet by mouth daily.  Dispense: 1 Package; Refill: 11   Plan:   1. Contraception: OCP (estrogen/progesterone) 2. Continue PNV's 3. Follow up in: 6 weeks or as needed.    Brock Bad MD 05-18-2018

## 2018-06-29 ENCOUNTER — Ambulatory Visit: Payer: Managed Care, Other (non HMO) | Admitting: Obstetrics

## 2018-06-30 ENCOUNTER — Telehealth: Payer: Self-pay | Admitting: Obstetrics

## 2018-07-06 ENCOUNTER — Encounter: Payer: Self-pay | Admitting: Obstetrics

## 2018-07-07 ENCOUNTER — Encounter: Payer: Self-pay | Admitting: Obstetrics

## 2018-08-01 ENCOUNTER — Other Ambulatory Visit (HOSPITAL_COMMUNITY)
Admission: RE | Admit: 2018-08-01 | Discharge: 2018-08-01 | Disposition: A | Discharge status: Home / Self Care | Payer: Managed Care, Other (non HMO) | Source: Ambulatory Visit | Attending: Obstetrics | Admitting: Obstetrics

## 2018-08-01 ENCOUNTER — Ambulatory Visit (INDEPENDENT_AMBULATORY_CARE_PROVIDER_SITE_OTHER): Payer: Managed Care, Other (non HMO) | Admitting: Obstetrics

## 2018-08-01 ENCOUNTER — Encounter: Payer: Self-pay | Admitting: Obstetrics

## 2018-08-01 VITALS — BP 112/71 | HR 96 | Wt 117.6 lb

## 2018-08-01 DIAGNOSIS — G43119 Migraine with aura, intractable, without status migrainosus: Secondary | ICD-10-CM

## 2018-08-01 DIAGNOSIS — N898 Other specified noninflammatory disorders of vagina: Secondary | ICD-10-CM | POA: Insufficient documentation

## 2018-08-01 DIAGNOSIS — I456 Pre-excitation syndrome: Secondary | ICD-10-CM | POA: Diagnosis not present

## 2018-08-01 DIAGNOSIS — Z3009 Encounter for other general counseling and advice on contraception: Secondary | ICD-10-CM | POA: Diagnosis not present

## 2018-08-01 NOTE — Progress Notes (Signed)
Pt presents to change BCP d/t migraines and prolonged spotting since Oct 2019.

## 2018-08-01 NOTE — Progress Notes (Signed)
Subjective:    Alyssa Munoz is a 23 y.o. female who presents for contraception counseling. The patient has no complaints today. The patient is sexually active. Pertinent past medical history: migraines - with aura, and vaginal spotting irregularly since October 2021, on OCP's.  The information documented in the HPI was reviewed and verified.  Menstrual History: OB History    Gravida  1   Para  1   Term  1   Preterm      AB      Living  1     SAB      TAB      Ectopic      Multiple  0   Live Births  1            Patient's last menstrual period was 05/18/2018.   Patient Active Problem List   Diagnosis Date Noted  . Labor and delivery, indication for care 03/25/2018  . Group beta Strep positive 03/07/2018  . Abnormal antenatal test 09/27/2017  . Wolff-Parkinson-White (WPW) syndrome 09/17/2017  . Supervision of other normal pregnancy, antepartum 09/17/2017  . 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 H/O cardiac radiofrequency ablation 09/09/2012   Past Medical History:  Diagnosis Date  . Anemia   . Anxiety   . Chronic chest wall pain 2013  . Depression   . History of Wolff-Parkinson-White (WPW) syndrome    s/p ablation 2011 [facility] (Peds Cards Dr. Cristy Folks)  . Panic attack     Past Surgical History:  Procedure Laterality Date  . CARDIAC ELECTROPHYSIOLOGY STUDY AND ABLATION     wolffe parkinson syndrome     Current Outpatient Medications:  .  acetaminophen (TYLENOL) 325 MG tablet, Take 2 tablets (650 mg total) by mouth every 4 (four) hours as needed (for pain scale < 4). (Patient not taking: Reported on 08/01/2018), Disp: , Rfl:  .  ibuprofen (ADVIL,MOTRIN) 600 MG tablet, Take 1 tablet (600 mg total) by mouth every 6 (six) hours as needed. (Patient not taking: Reported on 08/01/2018), Disp: 30 tablet, Rfl: 0 Allergies  Allergen Reactions  . Latex Hives and Itching    Social History   Tobacco Use   . Smoking status: Former Smoker    Packs/day: 0.25    Types: Cigarettes    Last attempt to quit: 08/2017    Years since quitting: 0.9  . Smokeless tobacco: Never Used  Substance Use Topics  . Alcohol use: No    Family History  Problem Relation Age of Onset  . Diabetes Maternal Grandmother   . Lung cancer Maternal Grandfather   . Sudden Cardiac Death Neg Hx        Review of Systems Constitutional: negative for weight loss Genitourinary:negative for abnormal menstrual periods and vaginal discharge   Objective:   BP 112/71   Pulse 96   Wt 117 lb 9.6 oz (53.3 kg)   LMP 05/18/2018   BMI 20.83 kg/m    PE:  Deferred  Lab Review Urine pregnancy test Labs reviewed yes Radiologic studies reviewed yes  50% of 15 min visit spent on counseling and coordination of care.    Assessment:    23 y.o., discontinuing OCP (estrogen/progesterone), contraindications migraines with aura.   Plan:   1. Encounter for other general counseling and advice on contraception - considering non-hormonal contraception because of history of migraines with aura - ParaGuard IUD recommended  2. Vaginal discharge Rx: - Cervicovaginal ancillary only  3. Wolff-Parkinson-White (WPW)  syndrome - S/P Radiofrequency Ablation.  Doing well clinically  4. Intractable migraine with aura without status migrainosus - may need Neurology referral   All questions answered. Chlamydia specimen. Contraception: IUD. Follow up in a few weeks. GC specimen. Wet prep.   No orders of the defined types were placed in this encounter.  No orders of the defined types were placed in this encounter.   Brock Bad MD 08-01-2018

## 2018-08-02 ENCOUNTER — Telehealth: Payer: Self-pay

## 2018-08-02 ENCOUNTER — Other Ambulatory Visit: Payer: Self-pay | Admitting: Obstetrics

## 2018-08-02 DIAGNOSIS — N760 Acute vaginitis: Secondary | ICD-10-CM

## 2018-08-02 DIAGNOSIS — B9689 Other specified bacterial agents as the cause of diseases classified elsewhere: Secondary | ICD-10-CM

## 2018-08-02 DIAGNOSIS — B373 Candidiasis of vulva and vagina: Secondary | ICD-10-CM

## 2018-08-02 DIAGNOSIS — B3731 Acute candidiasis of vulva and vagina: Secondary | ICD-10-CM

## 2018-08-02 LAB — CERVICOVAGINAL ANCILLARY ONLY
Bacterial vaginitis: POSITIVE — AB
Candida vaginitis: POSITIVE — AB
Chlamydia: NEGATIVE
Neisseria Gonorrhea: NEGATIVE
Trichomonas: NEGATIVE

## 2018-08-02 MED ORDER — FLUCONAZOLE 150 MG PO TABS: 150.0000 mg | ORAL_TABLET | Freq: Once | ORAL | 2 refills | Status: AC

## 2018-08-02 MED ORDER — TINIDAZOLE 500 MG PO TABS: 1000.0000 mg | ORAL_TABLET | Freq: Every day | ORAL | 2 refills | Status: DC

## 2018-08-02 NOTE — Telephone Encounter (Signed)
Unable to reach patient/leave message, got a recording that the telephone number is not available.  Patient has [employer] MCD, it will not pay for Rx (Tinidazole). She can print coupon on Goodrx.com to reduce out of pocket cost.

## 2019-01-25 ENCOUNTER — Other Ambulatory Visit: Payer: Self-pay

## 2019-01-25 ENCOUNTER — Encounter (HOSPITAL_COMMUNITY): Payer: Self-pay

## 2019-01-25 ENCOUNTER — Ambulatory Visit (HOSPITAL_COMMUNITY)
Admission: EM | Admit: 2019-01-25 | Discharge: 2019-01-25 | Disposition: A | Discharge status: Home / Self Care | Payer: Managed Care, Other (non HMO) | Attending: Family Medicine | Admitting: Family Medicine

## 2019-01-25 DIAGNOSIS — L301 Dyshidrosis [pompholyx]: Secondary | ICD-10-CM | POA: Diagnosis not present

## 2019-01-25 MED ORDER — TRIAMCINOLONE ACETONIDE 0.1 % EX CREA: 1.0000 "application " | TOPICAL_CREAM | Freq: Two times a day (BID) | CUTANEOUS | 0 refills | Status: DC

## 2019-01-25 NOTE — ED Provider Notes (Signed)
[facility]    CSN: [id number] Arrival date & time: 01/25/19  1339     History   Chief Complaint Chief Complaint  Patient presents with  . Allergic Reaction    HPI Alyssa Munoz is a 23 y.o. female.   Patient is a 23 year old female who presents today with rash to left hand.  Symptoms have been constant, worsening since approximate 6 months ago when she got a tattoo in the area.  She does have a past medical history of eczema.  She has been putting Neosporin on the rash without any relief.   Denies any fever, joint pain. Denies any recent changes in lotions, detergents, foods. No recent travel. Nobody else at home has the rash. Patient has been outside but denies any contact with plants or insects. No new foods or medications.   ROS per HPI        Past Medical History:  Diagnosis Date  . Anemia   . Anxiety   . Chronic chest wall pain 2013  . Depression   . History of Wolff-Parkinson-White (WPW) syndrome    s/p ablation 2011 [facility] (Peds Cards Dr. Cristy FolksGregory Fleming)  . Panic attack     Patient Active Problem List   Diagnosis Date Noted  . Labor and delivery, indication for care 03/25/2018  . Group beta Strep positive 03/07/2018  . Abnormal antenatal test 09/27/2017  . Wolff-Parkinson-White (WPW) syndrome 09/17/2017  . Supervision of other normal pregnancy, antepartum 09/17/2017  . 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 H/O cardiac radiofrequency ablation 09/09/2012    Past Surgical History:  Procedure Laterality Date  . CARDIAC ELECTROPHYSIOLOGY STUDY AND ABLATION     wolffe parkinson syndrome    OB History    Gravida  1   Para  1   Term  1   Preterm      AB      Living  1     SAB      TAB      Ectopic      Multiple  0   Live Births  1            Home Medications    Prior to Admission medications   Medication Sig Start Date End Date Taking? Authorizing Provider   tinidazole (TINDAMAX) 500 MG tablet Take 2 tablets (1,000 mg total) by mouth daily with breakfast. 08/02/18   Brock BadHarper, Charles A, MD  triamcinolone cream (KENALOG) 0.1 % Apply 1 application topically 2 (two) times daily. 01/25/19   Janace ArisBast, Liliyana Thobe A, NP    Family History Family History  Problem Relation Age of Onset  . Diabetes Maternal Grandmother   . Lung cancer Maternal Grandfather   . Sudden Cardiac Death Neg Hx     Social History Social History   Tobacco Use  . Smoking status: Former Smoker    Packs/day: 0.25    Types: Cigarettes    Quit date: 08/2017    Years since quitting: 1.4  . Smokeless tobacco: Never Used  Substance Use Topics  . Alcohol use: No  . Drug use: Not Currently    Types: Marijuana     Allergies   Latex   Review of Systems Review of Systems   Physical Exam Triage Vital Signs ED Triage Vitals [01/25/19 1358]  Enc Vitals Group     BP 111/76     Pulse Rate 87     Resp 18     Temp 98.1  F (36.7 C)     Temp Source Oral     SpO2 98 %     Weight 130 lb (59 kg)     Height      Head Circumference      Peak Flow      Pain Score 5     Pain Loc      Pain Edu?      Excl. in [place]?    No data found.  Updated Vital Signs BP 111/76 (BP Location: Right Arm)   Pulse 87   Temp 98.1 F (36.7 C) (Oral)   Resp 18   Wt 130 lb (59 kg)   LMP 01/25/2019   SpO2 98%   BMI 23.03 kg/m   Visual Acuity Right Eye Distance:   Left Eye Distance:   Bilateral Distance:    Right Eye Near:   Left Eye Near:    Bilateral Near:     Physical Exam Vitals signs and nursing note reviewed.  Constitutional:      General: She is not in acute distress.    Appearance: Normal appearance. She is not ill-appearing, toxic-appearing or diaphoretic.  HENT:     Head: Normocephalic and atraumatic.     Nose: Nose normal.  Eyes:     Conjunctiva/sclera: Conjunctivae normal.  Pulmonary:     Effort: Pulmonary effort is normal.  Musculoskeletal: Normal range of motion.  Skin:     General: Skin is warm and dry.     Findings: Rash present.     Comments: See picture for detail   Neurological:     Mental Status: She is alert.  Psychiatric:        Mood and Affect: Mood normal.          UC Treatments / Results  Labs (all labs ordered are listed, but only abnormal results are displayed) Labs Reviewed - No data to display  EKG   Radiology No results found.  Procedures Procedures (including critical care time)  Medications Ordered in UC Medications - No data to display  Initial Impression / Assessment and Plan / UC Course  I have reviewed the triage vital signs and the nursing notes.  Pertinent labs & imaging results that were available during my care of the patient were reviewed by me and considered in my medical decision making (see chart for details).     Symptoms consistent with dyshidrotic eczema.   Treating with triamcinolone cream Final Clinical Impressions(s) / UC Diagnoses   Final diagnoses:  Dyshidrotic eczema     Discharge Instructions     Use the cream as prescribed. Follow-up for any continued or worsening problems    ED Prescriptions    Medication Sig Dispense Auth. Provider   triamcinolone cream (KENALOG) 0.1 % Apply 1 application topically 2 (two) times daily. 30 g Loura Halt A, NP     Controlled Substance Prescriptions [place] Controlled Substance Registry consulted? Not Applicable   Orvan July, NP 01/25/19 1450

## 2019-01-25 NOTE — Discharge Instructions (Addendum)
Use the cream as prescribed. Follow-up for any continued or worsening problems

## 2019-01-25 NOTE — ED Triage Notes (Signed)
Pt cc she got a tattoo 6 months ago. Pt states she started having a reaction from it then. Pt states her tattoo it began to rise and then it began to get infected. Pt states later a rash started the tattoo it just disappeared. 6 months later she has the same rash and the tattoo is gone.

## 2019-04-18 ENCOUNTER — Other Ambulatory Visit: Payer: Self-pay | Admitting: Obstetrics

## 2019-04-18 DIAGNOSIS — Z30011 Encounter for initial prescription of contraceptive pills: Secondary | ICD-10-CM

## 2019-04-20 ENCOUNTER — Other Ambulatory Visit: Payer: Self-pay | Admitting: Obstetrics

## 2019-04-20 DIAGNOSIS — Z30011 Encounter for initial prescription of contraceptive pills: Secondary | ICD-10-CM

## 2019-04-20 NOTE — Telephone Encounter (Signed)
Please review for refill.  

## 2019-04-28 ENCOUNTER — Other Ambulatory Visit (HOSPITAL_COMMUNITY)
Admission: RE | Admit: 2019-04-28 | Discharge: 2019-04-28 | Disposition: A | Discharge status: Home / Self Care | Payer: Managed Care, Other (non HMO) | Source: Ambulatory Visit | Attending: Obstetrics | Admitting: Obstetrics

## 2019-04-28 ENCOUNTER — Ambulatory Visit (INDEPENDENT_AMBULATORY_CARE_PROVIDER_SITE_OTHER): Payer: Managed Care, Other (non HMO) | Admitting: Obstetrics

## 2019-04-28 ENCOUNTER — Other Ambulatory Visit: Payer: Self-pay

## 2019-04-28 ENCOUNTER — Encounter: Payer: Self-pay | Admitting: Obstetrics

## 2019-04-28 VITALS — BP 106/72 | HR 72 | Ht 63.0 in | Wt 120.0 lb

## 2019-04-28 DIAGNOSIS — Z30013 Encounter for initial prescription of injectable contraceptive: Secondary | ICD-10-CM

## 2019-04-28 DIAGNOSIS — N898 Other specified noninflammatory disorders of vagina: Secondary | ICD-10-CM | POA: Diagnosis present

## 2019-04-28 DIAGNOSIS — Z01419 Encounter for gynecological examination (general) (routine) without abnormal findings: Secondary | ICD-10-CM

## 2019-04-28 DIAGNOSIS — Z3041 Encounter for surveillance of contraceptive pills: Secondary | ICD-10-CM

## 2019-04-28 DIAGNOSIS — Z3009 Encounter for other general counseling and advice on contraception: Secondary | ICD-10-CM

## 2019-04-28 DIAGNOSIS — G43821 Menstrual migraine, not intractable, with status migrainosus: Secondary | ICD-10-CM

## 2019-04-28 DIAGNOSIS — Z0000 Encounter for general adult medical examination without abnormal findings: Secondary | ICD-10-CM

## 2019-04-28 MED ORDER — MEDROXYPROGESTERONE ACETATE 150 MG/ML IM SUSP: 150.0000 mg | INTRAMUSCULAR | 4 refills | Status: DC

## 2019-04-28 MED ORDER — PRENATAL PLUS 27-1 MG PO TABS: 1.0000 | ORAL_TABLET | Freq: Every day | ORAL | 11 refills | Status: DC

## 2019-04-28 NOTE — Progress Notes (Signed)
Subjective:        Alyssa Munoz is a 23 y.o. female here for a routine exam.  Current complaints: Menstrual migraines.    Personal health questionnaire:  Is patient Ashkenazi Jewish, have a family history of breast and/or ovarian cancer: no Is there a family history of uterine cancer diagnosed at age < 42, gastrointestinal cancer, urinary tract cancer, family member who is a Field seismologist syndrome-associated carrier: no Is the patient overweight and hypertensive, family history of diabetes, personal history of gestational diabetes, preeclampsia or PCOS: no Is patient over 68, have PCOS,  family history of premature CHD under age 64, diabetes, smoke, have hypertension or peripheral artery disease:  no At any time, has a partner hit, kicked or otherwise hurt or frightened you?: no Over the past 2 weeks, have you felt down, depressed or hopeless?: no Over the past 2 weeks, have you felt little interest or pleasure in doing things?:no   Gynecologic History No LMP recorded. Contraception: OCP (estrogen/progesterone) Last Pap: 09-17-2017. Results were: normal Last mammogram: n/a. Results were: n/a  Obstetric History OB History  Gravida Para Term Preterm AB Living  1 1 1     1   SAB TAB Ectopic Multiple Live Births        0 1    # Outcome Date GA Lbr Len/2nd Weight Sex Delivery Anes PTL Lv  1 Term 03/25/18 [redacted]w[redacted]d 02:20 / 00:08 6 lb 11.9 oz (3.059 kg) M Vag-Spont EPI  LIV    Past Medical History:  Diagnosis Date  . Anemia   . Anxiety   . Chronic chest wall pain 2013  . Depression   . History of Wolff-Parkinson-White (WPW) syndrome    s/p ablation 2011 [facility] (Peds Cards Dr. Kathie Rhodes)  . Panic attack     Past Surgical History:  Procedure Laterality Date  . CARDIAC ELECTROPHYSIOLOGY STUDY AND ABLATION     wolffe parkinson syndrome     Current Outpatient Medications:  .  medroxyPROGESTERone (DEPO-PROVERA) 150 MG/ML injection, Inject 1 mL (150 mg total) into the muscle every 3  (three) months., Disp: 1 mL, Rfl: 4 .  prenatal vitamin w/FE, FA (PRENATAL 1 + 1) 27-1 MG TABS tablet, Take 1 tablet by mouth daily before breakfast., Disp: 30 tablet, Rfl: 11 .  tinidazole (TINDAMAX) 500 MG tablet, Take 2 tablets (1,000 mg total) by mouth daily with breakfast. (Patient not taking: Reported on 04/28/2019), Disp: 10 tablet, Rfl: 2 .  triamcinolone cream (KENALOG) 0.1 %, Apply 1 application topically 2 (two) times daily. (Patient not taking: Reported on 04/28/2019), Disp: 30 g, Rfl: 0 Allergies  Allergen Reactions  . Latex Hives and Itching    Social History   Tobacco Use  . Smoking status: Former Smoker    Packs/day: 0.25    Types: Cigarettes    Quit date: 08/2017    Years since quitting: 1.6  . Smokeless tobacco: Never Used  Substance Use Topics  . Alcohol use: No    Family History  Problem Relation Age of Onset  . Diabetes Maternal Grandmother   . Lung cancer Maternal Grandfather   . Sudden Cardiac Death Neg Hx       Review of Systems  Constitutional: negative for fatigue and weight loss Respiratory: negative for cough and wheezing Cardiovascular: negative for chest pain, fatigue and palpitations Gastrointestinal: negative for abdominal pain and change in bowel habits Musculoskeletal:negative for myalgias Neurological: positive for menstrual migraines Behavioral/Psych: negative for abusive relationship, depression Endocrine: negative for temperature  intolerance    Genitourinary:negative for abnormal menstrual periods, genital lesions, hot flashes, sexual problems and vaginal discharge Integument/breast: negative for breast lump, breast tenderness, nipple discharge and skin lesion(s)    Objective:       BP 106/72   Pulse 72   Ht 5\' 3"  (1.6 m)   Wt 120 lb (54.4 kg)   Breastfeeding No Comment: using BCP's   BMI 21.26 kg/m  General:   alert  Skin:   no rash or abnormalities  Lungs:   clear to auscultation bilaterally  Heart:   regular rate and  rhythm, S1, S2 normal, no murmur, click, rub or gallop  Breasts:   normal without suspicious masses, skin or nipple changes or axillary nodes  Abdomen:  normal findings: no organomegaly, soft, non-tender and no hernia  Pelvis:  External genitalia: normal general appearance Urinary system: urethral meatus normal and bladder without fullness, nontender Vaginal: normal without tenderness, induration or masses Cervix: normal appearance Adnexa: normal bimanual exam Uterus: anteverted and non-tender, normal size   Lab Review Urine pregnancy test Labs reviewed yes Radiologic studies reviewed no  50% of 25 min visit spent on counseling and coordination of care.   Assessment:    Healthy female exam.  1. Encounter for routine gynecological examination with Papanicolaou smear of cervix Rx: - Cytology - PAP  2. Encounter for surveillance of contraceptive pills - has migraines when taking placebo pills  3. Vaginal discharge Rx: - Cervicovaginal ancillary only( [place])  4. Encounter for other general counseling and advice on contraception - discussed contraceptive options, and patient wants to discontinue OCP's and start Depo Provera injections  5. Menstrual migraine with status migrainosus, not intractable  6. Encounter for initial prescription of injectable contraceptive Rx: - medroxyPROGESTERone (DEPO-PROVERA) 150 MG/ML injection; Inject 1 mL (150 mg total) into the muscle every 3 (three) months.  Dispense: 1 mL; Refill: 4  7. Routine adult health maintenance Rx: - prenatal vitamin w/FE, FA (PRENATAL 1 + 1) 27-1 MG TABS tablet; Take 1 tablet by mouth daily before breakfast.  Dispense: 30 tablet; Refill: 11    Plan:    Education reviewed: calcium supplements, depression evaluation, low fat, low cholesterol diet, safe sex/STD prevention, self breast exams and weight bearing exercise. Contraception: OCP (estrogen/progesterone). Follow up in: 1 year.   Meds ordered this  encounter  Medications  . medroxyPROGESTERone (DEPO-PROVERA) 150 MG/ML injection    Sig: Inject 1 mL (150 mg total) into the muscle every 3 (three) months.    Dispense:  1 mL    Refill:  4  . prenatal vitamin w/FE, FA (PRENATAL 1 + 1) 27-1 MG TABS tablet    Sig: Take 1 tablet by mouth daily before breakfast.    Dispense:  30 tablet    Refill:  11     , MD 04/28/2019 11:42 AM

## 2019-04-28 NOTE — Progress Notes (Signed)
Patient Presents For Annual Exam Today.  KCL:EXNTZ BCPs . Last Pap; 09/17/2017 WNL  Contraception: OCP's STD SCREENING: Desires , but no blood work.   CC: pt states birth control are causing headaches wants to discuss other options. Pt states "brown pills" cause headaches .

## 2019-05-02 ENCOUNTER — Ambulatory Visit (INDEPENDENT_AMBULATORY_CARE_PROVIDER_SITE_OTHER): Payer: Managed Care, Other (non HMO)

## 2019-05-02 ENCOUNTER — Ambulatory Visit: Payer: Managed Care, Other (non HMO)

## 2019-05-02 ENCOUNTER — Other Ambulatory Visit: Payer: Self-pay

## 2019-05-02 DIAGNOSIS — Z3202 Encounter for pregnancy test, result negative: Secondary | ICD-10-CM | POA: Diagnosis not present

## 2019-05-02 DIAGNOSIS — Z3042 Encounter for surveillance of injectable contraceptive: Secondary | ICD-10-CM | POA: Diagnosis not present

## 2019-05-02 LAB — POCT URINE PREGNANCY: Preg Test, Ur: NEGATIVE

## 2019-05-02 MED ORDER — MEDROXYPROGESTERONE ACETATE 150 MG/ML IM SUSP
150.0000 mg | Freq: Once | INTRAMUSCULAR | Status: AC
  Administered 2019-05-02: 150 mg via INTRAMUSCULAR

## 2019-05-02 NOTE — Progress Notes (Signed)
Nurse visit for pt supply Depo Depo given RUOQ w/o difficulty  Next Depo due Dec 29-Jan 12

## 2019-05-02 NOTE — Progress Notes (Signed)
Patient seen and assessed by nursing staff during this encounter. I have reviewed the chart and agree with the documentation and plan.  Linzi Ohlinger, MD 05/02/2019 1:53 PM    

## 2019-05-08 ENCOUNTER — Other Ambulatory Visit: Payer: Self-pay | Admitting: Obstetrics

## 2019-05-08 DIAGNOSIS — B9689 Other specified bacterial agents as the cause of diseases classified elsewhere: Secondary | ICD-10-CM

## 2019-05-08 DIAGNOSIS — N760 Acute vaginitis: Secondary | ICD-10-CM

## 2019-05-08 LAB — CERVICOVAGINAL ANCILLARY ONLY
Bacterial Vaginitis (gardnerella): POSITIVE — AB
Candida Glabrata: NEGATIVE
Candida Vaginitis: NEGATIVE
Chlamydia: NEGATIVE
Comment: NEGATIVE
Comment: NEGATIVE
Comment: NEGATIVE
Comment: NEGATIVE
Comment: NEGATIVE
Comment: NORMAL
Neisseria Gonorrhea: NEGATIVE
Trichomonas: NEGATIVE

## 2019-05-08 MED ORDER — TINIDAZOLE 500 MG PO TABS: 1000.0000 mg | ORAL_TABLET | Freq: Every day | ORAL | 2 refills | Status: DC

## 2019-07-26 ENCOUNTER — Ambulatory Visit (INDEPENDENT_AMBULATORY_CARE_PROVIDER_SITE_OTHER): Payer: Managed Care, Other (non HMO)

## 2019-07-26 ENCOUNTER — Other Ambulatory Visit: Payer: Self-pay

## 2019-07-26 DIAGNOSIS — Z3042 Encounter for surveillance of injectable contraceptive: Secondary | ICD-10-CM

## 2019-07-26 DIAGNOSIS — Z3041 Encounter for surveillance of contraceptive pills: Secondary | ICD-10-CM

## 2019-07-26 MED ORDER — MEDROXYPROGESTERONE ACETATE 150 MG/ML IM SUSP
150.0000 mg | Freq: Once | INTRAMUSCULAR | Status: AC
  Administered 2019-07-26: 10:00:00 150 mg via INTRAMUSCULAR

## 2019-07-26 NOTE — Progress Notes (Signed)
Agree with A & P. 

## 2019-07-26 NOTE — Progress Notes (Signed)
Nurse visit for Depo Pt is on time for injection Depo given LUOQ without difficulty Next Depo due, Mar 24-Apr 7, pt agrees

## 2019-10-18 ENCOUNTER — Ambulatory Visit (INDEPENDENT_AMBULATORY_CARE_PROVIDER_SITE_OTHER): Payer: Managed Care, Other (non HMO)

## 2019-10-18 ENCOUNTER — Other Ambulatory Visit: Payer: Self-pay

## 2019-10-18 VITALS — BP 110/71 | HR 62 | Ht 63.0 in | Wt 130.0 lb

## 2019-10-18 DIAGNOSIS — Z3042 Encounter for surveillance of injectable contraceptive: Secondary | ICD-10-CM

## 2019-10-18 MED ORDER — MEDROXYPROGESTERONE ACETATE 150 MG/ML IM SUSP
150.0000 mg | Freq: Once | INTRAMUSCULAR | Status: AC
  Administered 2019-10-18: 10:00:00 150 mg via INTRAMUSCULAR

## 2019-10-18 NOTE — Progress Notes (Signed)
Presents for DEPO injection, given in RUOQ, tolerated well.  Next DEPO June 16-30/2021  Administrations This Visit    medroxyPROGESTERone (DEPO-PROVERA) injection 150 mg    Admin Date 10/18/2019 Action Given Dose 150 mg Route Intramuscular Administered By Maretta Bees, RMA

## 2019-11-14 ENCOUNTER — Telehealth (INDEPENDENT_AMBULATORY_CARE_PROVIDER_SITE_OTHER): Payer: Managed Care, Other (non HMO) | Admitting: Obstetrics

## 2019-11-14 ENCOUNTER — Encounter: Payer: Self-pay | Admitting: Obstetrics

## 2019-11-14 DIAGNOSIS — Z3009 Encounter for other general counseling and advice on contraception: Secondary | ICD-10-CM

## 2019-11-14 NOTE — Progress Notes (Signed)
Patient did not answer nurse's phone call 

## 2019-11-14 NOTE — Progress Notes (Signed)
GYN Consult for contraception  Current contraception method: Depo  LMP:  CC:

## 2019-11-30 ENCOUNTER — Telehealth (INDEPENDENT_AMBULATORY_CARE_PROVIDER_SITE_OTHER): Payer: Managed Care, Other (non HMO) | Admitting: Obstetrics

## 2019-11-30 ENCOUNTER — Encounter: Payer: Self-pay | Admitting: Obstetrics

## 2019-11-30 DIAGNOSIS — Z30011 Encounter for initial prescription of contraceptive pills: Secondary | ICD-10-CM | POA: Diagnosis not present

## 2019-11-30 DIAGNOSIS — Z3009 Encounter for other general counseling and advice on contraception: Secondary | ICD-10-CM

## 2019-11-30 MED ORDER — NORETHIN ACE-ETH ESTRAD-FE 1-20 MG-MCG(24) PO TABS: 1.0000 | ORAL_TABLET | Freq: Every day | ORAL | 11 refills | Status: DC

## 2019-11-30 NOTE — Progress Notes (Signed)
    GYNECOLOGY VIRTUAL VISIT ENCOUNTER NOTE  Provider location: [facility] for [facility] [place]   I connected with Alyssa Munoz on 11/30/19 at  2:45 PM EDT by MyChart Video Encounter at home and verified that I am speaking with the correct person using two identifiers.   I discussed the limitations, risks, security and privacy concerns of performing an evaluation and management service virtually and the availability of in person appointments. I also discussed with the patient that there may be a patient responsible charge related to this service. The patient expressed understanding and agreed to proceed.   History:  Alyssa Munoz is a 24 y.o. G5P1001 female being evaluated today for contraceptive management.  Complains of irregular vaginal bleeding with Depo Provera injections.  Requests discontinuing Depo and starting OCP's.  She denies any abnormal vaginal discharge, pelvic pain or other concerns.       Past Medical History:  Diagnosis Date  . Anemia   . Anxiety   . Chronic chest wall pain 2013  . Depression   . History of Wolff-Parkinson-White (WPW) syndrome    s/p ablation 2011 [facility] (Peds Cards Dr. Cristy Folks)  . Panic attack    Past Surgical History:  Procedure Laterality Date  . CARDIAC ELECTROPHYSIOLOGY STUDY AND ABLATION     wolffe parkinson syndrome   The following portions of the patient's history were reviewed and updated as appropriate: allergies, current medications, past family history, past medical history, past social history, past surgical history and problem list.   Health Maintenance:  Normal pap and negative HRHPV on 04-28-2019.    Review of Systems:  Pertinent items noted in HPI and remainder of comprehensive ROS otherwise negative.  Physical Exam:   General:  Alert, oriented and cooperative. Patient appears to be in no acute distress.  Mental Status: Normal mood and affect. Normal behavior. Normal judgment and thought content.     Respiratory: Normal respiratory effort, no problems with respiration noted  Rest of physical exam deferred due to type of encounter  Labs and Imaging No results found for this or any previous visit (from the past 336 hour(s)). No results found.     Assessment and Plan:     1. Encounter for other general counseling and advice on contraception - wants to stop Depo and start OCP's  2. Encounter for initial prescription of contraceptive pills Rx: - Norethindrone Acetate-Ethinyl Estrad-FE (LOESTRIN 24 FE) 1-20 MG-MCG(24) tablet; Take 1 tablet by mouth daily.  Dispense: 1 Package; Refill: 11       I discussed the assessment and treatment plan with the patient. The patient was provided an opportunity to ask questions and all were answered. The patient agreed with the plan and demonstrated an understanding of the instructions.   The patient was advised to call back or seek an in-person evaluation/go to the ED if the symptoms worsen or if the condition fails to improve as anticipated.  I provided 15 minutes of face-to-face time during this encounter.   Coral Ceo, MD Center for [facility], [facility] 11/30/2019

## 2020-01-10 ENCOUNTER — Ambulatory Visit: Payer: Managed Care, Other (non HMO)

## 2020-08-14 ENCOUNTER — Emergency Department (HOSPITAL_COMMUNITY): Payer: Medicaid Other

## 2020-08-14 ENCOUNTER — Emergency Department (HOSPITAL_COMMUNITY)
Admission: EM | Admit: 2020-08-14 | Discharge: 2020-08-14 | Disposition: A | Discharge status: Home / Self Care | Payer: Medicaid Other | Attending: Emergency Medicine | Admitting: Emergency Medicine

## 2020-08-14 ENCOUNTER — Encounter (HOSPITAL_COMMUNITY): Payer: Self-pay

## 2020-08-14 DIAGNOSIS — Z91040 Latex allergy status: Secondary | ICD-10-CM | POA: Insufficient documentation

## 2020-08-14 DIAGNOSIS — Z87891 Personal history of nicotine dependence: Secondary | ICD-10-CM | POA: Diagnosis not present

## 2020-08-14 DIAGNOSIS — R569 Unspecified convulsions: Secondary | ICD-10-CM | POA: Diagnosis present

## 2020-08-14 LAB — COMPREHENSIVE METABOLIC PANEL
ALT: 16 U/L (ref 0–44)
AST: 22 U/L (ref 15–41)
Albumin: 3.5 g/dL (ref 3.5–5.0)
Alkaline Phosphatase: 45 U/L (ref 38–126)
Anion gap: 11 (ref 5–15)
BUN: 8 mg/dL (ref 6–20)
CO2: 21 mmol/L — ABNORMAL LOW (ref 22–32)
Calcium: 9.3 mg/dL (ref 8.9–10.3)
Chloride: 105 mmol/L (ref 98–111)
Creatinine, Ser: 0.74 mg/dL (ref 0.44–1.00)
GFR, Estimated: >60 mL/min (ref >60)
Glucose, Bld: 91 mg/dL (ref 70–99)
Potassium: 4.2 mmol/L (ref 3.5–5.1)
Sodium: 137 mmol/L (ref 135–145)
Total Bilirubin: 0.4 mg/dL (ref 0.3–1.2)
Total Protein: 6.5 g/dL (ref 6.5–8.1)

## 2020-08-14 LAB — I-STAT BETA HCG BLOOD, ED (MC, WL, AP ONLY): I-stat hCG, quantitative: <5 m[IU]/mL (ref <5)

## 2020-08-14 LAB — CBC WITH DIFFERENTIAL/PLATELET
Abs Immature Granulocytes: 0.02 10*3/uL (ref 0.00–0.07)
Basophils Absolute: 0 10*3/uL (ref 0.0–0.1)
Basophils Relative: 0 %
Eosinophils Absolute: 0.2 10*3/uL (ref 0.0–0.5)
Eosinophils Relative: 3 %
HCT: 34.8 % — ABNORMAL LOW (ref 36.0–46.0)
Hemoglobin: 11.9 g/dL — ABNORMAL LOW (ref 12.0–15.0)
Immature Granulocytes: 0 %
Lymphocytes Relative: 43 %
Lymphs Abs: 3.5 10*3/uL (ref 0.7–4.0)
MCH: 32.1 pg (ref 26.0–34.0)
MCHC: 34.2 g/dL (ref 30.0–36.0)
MCV: 93.8 fL (ref 80.0–100.0)
Monocytes Absolute: 0.5 10*3/uL (ref 0.1–1.0)
Monocytes Relative: 6 %
Neutro Abs: 3.9 10*3/uL (ref 1.7–7.7)
Neutrophils Relative %: 48 %
Platelets: 317 10*3/uL (ref 150–400)
RBC: 3.71 MIL/uL — ABNORMAL LOW (ref 3.87–5.11)
RDW: 13.4 % (ref 11.5–15.5)
WBC: 8.1 10*3/uL (ref 4.0–10.5)
nRBC: 0 % (ref 0.0–0.2)

## 2020-08-14 LAB — RAPID URINE DRUG SCREEN, HOSP PERFORMED
Amphetamines: NOT DETECTED
Barbiturates: NOT DETECTED
Benzodiazepines: NOT DETECTED
Cocaine: NOT DETECTED
Opiates: NOT DETECTED
Tetrahydrocannabinol: POSITIVE — AB

## 2020-08-14 LAB — URINALYSIS, ROUTINE W REFLEX MICROSCOPIC
Bacteria, UA: NONE SEEN
Bilirubin Urine: NEGATIVE
Glucose, UA: NEGATIVE mg/dL
Ketones, ur: NEGATIVE mg/dL
Leukocytes,Ua: NEGATIVE
Nitrite: NEGATIVE
Protein, ur: NEGATIVE mg/dL
Specific Gravity, Urine: 1.015 (ref 1.005–1.030)
pH: 6 (ref 5.0–8.0)

## 2020-08-14 LAB — ETHANOL: Alcohol, Ethyl (B): <10 mg/dL (ref <10)

## 2020-08-14 LAB — MAGNESIUM: Magnesium: 2.3 mg/dL (ref 1.7–2.4)

## 2020-08-14 MED ORDER — SODIUM CHLORIDE 0.9 % IV BOLUS
1000.0000 mL | Freq: Once | INTRAVENOUS | Status: AC
  Administered 2020-08-14: 1000 mL via INTRAVENOUS

## 2020-08-14 MED ORDER — LEVETIRACETAM 500 MG PO TABS: 500.0000 mg | ORAL_TABLET | Freq: Two times a day (BID) | ORAL | 0 refills | Status: DC

## 2020-08-14 MED ORDER — FAMOTIDINE IN NACL 20-0.9 MG/50ML-% IV SOLN: 20.0000 mg | Freq: Once | INTRAVENOUS | Status: DC

## 2020-08-14 MED ORDER — METHYLPREDNISOLONE SODIUM SUCC 125 MG IJ SOLR: 125.0000 mg | Freq: Once | INTRAMUSCULAR | Status: DC

## 2020-08-14 NOTE — ED Provider Notes (Signed)
Presents today [facility] EMERGENCY DEPARTMENT Provider Note   CSN: [id number] Arrival date & time: 08/14/20  4818     History No chief complaint on file.   Alyssa Munoz is a 25 y.o. female who presents to the emergency department from home via EMS.  Per EMS report, patient was asleep in bed with her boyfriend this morning, when she had what was described as seizure-like activity lasting approximately 3 minutes.  There was no head trauma; incident occurred during patient's sleep. No medications were administered by EMS.  History provided by patient at this time.  She denies any memory of the incident. States that she does not even remember going to bed last night, remembers waking up when being loaded into the ambulance this morning. Per EMS report patient was postictal for much of the ride to the hospital, however has become alert and oriented x4 at the time of this evaluation.  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  She is not on any seizure medication.  At this time patient endorses headache, lightheadedness, but denies nausea, vomiting, diarrhea, chest pain, shortness of breath, confusion, blurry vision or double vision.  Patient endorses use of marijuana, most recently last night prior to going to bed.  States she has never had a reaction similar to this in the past.  Denies alcohol, other illicit drug use.  She denies any recent medication changes.    I personally read the patient medical history she is history of WPW, status post radioablation in 2015.  She has not had any cardiac issues since that time.  Patient on oral contraceptive pills.  HPI     Past Medical History:  Diagnosis Date  . Anemia   . Anxiety   . Chronic chest wall pain 2013  . Depression   . History of Wolff-Parkinson-White (WPW) syndrome    s/p ablation 2011 [facility] (Peds Cards Dr. Cristy Folks)  . Panic attack     Patient Active Problem List    Diagnosis Date Noted  . Labor and delivery, indication for care 03/25/2018  . Group beta Strep positive 03/07/2018  . Abnormal antenatal test 09/27/2017  . Wolff-Parkinson-White (WPW) syndrome 09/17/2017  . Supervision of other normal pregnancy, antepartum 09/17/2017  . 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 H/O cardiac radiofrequency ablation 09/09/2012    Past Surgical History:  Procedure Laterality Date  . CARDIAC ELECTROPHYSIOLOGY STUDY AND ABLATION     wolffe parkinson syndrome     OB History    Gravida  1   Para  1   Term  1   Preterm      AB      Living  1     SAB      IAB      Ectopic      Multiple  0   Live Births  1           Family History  Problem Relation Age of Onset  . Diabetes Maternal Grandmother   . Lung cancer Maternal Grandfather   . Sudden Cardiac Death Neg Hx     Social History   Tobacco Use  . Smoking status: Former Smoker    Packs/day: 0.25    Types: Cigarettes    Quit date: 08/2017    Years since quitting: 2.9  . Smokeless tobacco: Never Used  Vaping Use  . Vaping Use: Never used  Substance Use  Topics  . Alcohol use: No  . Drug use: Not Currently    Types: Marijuana    Home Medications Prior to Admission medications   Medication Sig Start Date End Date Taking? Authorizing Provider  Norethindrone Acetate-Ethinyl Estrad-FE (LOESTRIN 24 FE) 1-20 MG-MCG(24) tablet Take 1 tablet by mouth daily. 11/30/19   Brock Bad, MD  prenatal vitamin w/FE, FA (PRENATAL 1 + 1) 27-1 MG TABS tablet Take 1 tablet by mouth daily before breakfast. 04/28/19   Brock Bad, MD  tinidazole (TINDAMAX) 500 MG tablet Take 2 tablets (1,000 mg total) by mouth daily with breakfast. Patient not taking: Reported on 07/26/2019 05/08/19   Brock Bad, MD  triamcinolone cream (KENALOG) 0.1 % Apply 1 application topically 2 (two) times daily. Patient not taking: Reported on 04/28/2019  01/25/19   Janace Aris, NP    Allergies    Latex  Review of Systems   Review of Systems  Constitutional: Negative.   HENT: Negative.   Eyes: Negative.  Negative for visual disturbance.  Respiratory: Negative.   Cardiovascular: Negative.   Gastrointestinal: Negative.   Genitourinary: Negative.   Musculoskeletal: Negative.   Skin: Negative.   Neurological: Positive for seizures, light-headedness and headaches. Negative for dizziness.   Physical Exam Updated Vital Signs BP (!) 114/91   Pulse (!) 41   Temp 98.2 F (36.8 C) (Oral)   Resp 15   Ht 5\' 3"  (1.6 m)   Wt 56.7 kg   SpO2 100%   BMI 22.14 kg/m   Physical Exam Vitals and nursing note reviewed.  Constitutional:      General: She is not in acute distress.    Appearance: She is normal weight.  HENT:     Head: Normocephalic and atraumatic.     Nose: Nose normal.     Mouth/Throat:     Mouth: Mucous membranes are moist. No lacerations.     Tongue: No lesions.     Pharynx: Oropharynx is clear. Uvula midline. No oropharyngeal exudate or posterior oropharyngeal erythema.     Tonsils: No tonsillar exudate.  Eyes:     General: Lids are normal. Vision grossly intact.        Right eye: No discharge.        Left eye: No discharge.     Extraocular Movements: Extraocular movements intact.     Conjunctiva/sclera: Conjunctivae normal.     Pupils: Pupils are equal, round, and reactive to light.     Visual Fields: Right eye visual fields normal and left eye visual fields normal.  Neck:     Trachea: Trachea and phonation normal.  Cardiovascular:     Rate and Rhythm: Normal rate and regular rhythm.     Pulses: Normal pulses.          Radial pulses are 2+ on the right side and 2+ on the left side.       Dorsalis pedis pulses are 2+ on the right side and 2+ on the left side.     Heart sounds: Normal heart sounds. No murmur heard.   Pulmonary:     Effort: Pulmonary effort is normal. No respiratory distress.     Breath sounds:  Normal breath sounds. No wheezing or rales.  Chest:     Chest wall: No deformity, swelling, tenderness, crepitus or edema.  Abdominal:     General: Bowel sounds are normal. There is no distension.     Palpations: Abdomen is soft.     Tenderness: There  is no abdominal tenderness. There is no right CVA tenderness or left CVA tenderness.  Musculoskeletal:        General: No deformity.     Cervical back: Full passive range of motion without pain, normal range of motion and neck supple. No rigidity or crepitus. No pain with movement, spinous process tenderness or muscular tenderness.     Right lower leg: No edema.     Left lower leg: No edema.     Comments: 5/5 grip strength bilaterally 5/5 strength in plantar dorsiflexion bilateral 5/5 strength in knee flexion extension bilaterally  Lymphadenopathy:     Cervical: No cervical adenopathy.  Skin:    General: Skin is warm and dry.     Capillary Refill: Capillary refill takes less than 2 seconds.  Neurological:     General: No focal deficit present.     Mental Status: She is alert and oriented to person, place, and time.     Cranial Nerves: Cranial nerves are intact.     Sensory: Sensation is intact.     Motor: Motor function is intact.     Coordination: Coordination is intact.     Gait: Gait is intact.     Deep Tendon Reflexes:     Reflex Scores:      Bicep reflexes are 2+ on the right side and 2+ on the left side.      Patellar reflexes are 2+ on the right side and 2+ on the left side. Psychiatric:        Attention and Perception: Attention normal.        Mood and Affect: Mood normal.        Speech: Speech normal.        Thought Content: Thought content normal.        Cognition and Memory: Cognition normal.     ED Results / Procedures / Treatments   Labs (all labs ordered are listed, but only abnormal results are displayed) Labs Reviewed  COMPREHENSIVE METABOLIC PANEL - Abnormal; Notable for the following components:       Result Value   CO2 21 (*)    All other components within normal limits  CBC WITH DIFFERENTIAL/PLATELET - Abnormal; Notable for the following components:   RBC 3.71 (*)    Hemoglobin 11.9 (*)    HCT 34.8 (*)    All other components within normal limits  URINALYSIS, ROUTINE W REFLEX MICROSCOPIC - Abnormal; Notable for the following components:   Hgb urine dipstick MODERATE (*)    All other components within normal limits  RAPID URINE DRUG SCREEN, HOSP PERFORMED - Abnormal; Notable for the following components:   Tetrahydrocannabinol POSITIVE (*)    All other components within normal limits  ETHANOL  MAGNESIUM  I-STAT BETA HCG BLOOD, ED ([facility], [facility], [facility] ONLY)    EKG EKG Interpretation  Date/Time:  Wednesday August 14 2020 07:29:59 EST Ventricular Rate:  72 PR Interval:    QRS Duration: 81 QT Interval:  371 QTC Calculation: 406 R Axis:   34 Text Interpretation: Sinus rhythm ST elev, probable normal early repol pattern Confirmed by Lorre Nick ([id number]) on 08/14/2020 10:08:16 AM   Radiology CT Head Wo Contrast  Result Date: 08/14/2020 CLINICAL DATA:  Nontraumatic seizure EXAM: CT HEAD WITHOUT CONTRAST TECHNIQUE: Contiguous axial images were obtained from the base of the skull through the vertex without intravenous contrast. COMPARISON:  None. FINDINGS: Brain: No evidence of acute infarction, hemorrhage, hydrocephalus, extra-axial collection or mass lesion/mass effect. Vascular: No hyperdense  vessel or unexpected calcification. Skull: Normal. Negative for fracture or focal lesion. Sinuses/Orbits: No acute finding. Other: None. IMPRESSION: No acute intracranial abnormality. If there is continued clinical concern for intracranial pathology, further evaluation with MRI should be considered. Electronically Signed   By: Acquanetta Belling M.D.   On: 08/14/2020 08:21   MR BRAIN WO CONTRAST  Result Date: 08/14/2020 CLINICAL DATA:  Seizure nontraumatic EXAM: MRI HEAD WITHOUT CONTRAST TECHNIQUE:  Multiplanar, multiecho pulse sequences of the brain and surrounding structures were obtained without intravenous contrast. COMPARISON:  CT head 08/14/2020 FINDINGS: Brain: No acute infarction, hemorrhage, hydrocephalus, extra-axial collection or mass lesion. Normal white matter Vascular: Normal arterial flow voids Skull and upper cervical spine: No focal skeletal lesion. Sinuses/Orbits: Paranasal sinuses clear.  Negative orbit Other: None IMPRESSION: Normal MRI head without contrast. Electronically Signed   By: Marlan Palau M.D.   On: 08/14/2020 13:29   EEG adult  Result Date: 08/14/2020 Charlsie Quest, MD     08/14/2020  2:36 PM Patient Name: Alyssa Munoz MRN: [id number] Epilepsy Attending: Charlsie Quest Referring Physician/Provider: Dr. Lindie Spruce Date: 08/14/2020 Duration: 26.53 minutes Patient history: 25 year old female with history of neonatal seizures now with generalized tonic-clonic seizure-like episode.  EEG to evaluate for seizures. Level of alertness: Awake, asleep AEDs during EEG study: None Technical aspects: This EEG study was done with scalp electrodes positioned according to the 10-20 International system of electrode placement. Electrical activity was acquired at a sampling rate of 500Hz  and reviewed with a high frequency filter of 70Hz  and a low frequency filter of 1Hz . EEG data were recorded continuously and digitally stored. Description: The posterior dominant rhythm consists of 10 Hz activity of moderate voltage (25-35 uV) seen predominantly in posterior head regions, symmetric and reactive to eye opening and eye closing. Sleep was characterized by vertex waves, sleep spindles (12 to 14 Hz), maximal frontocentral region.  EEG showed intermittent 3 to 5 Hz theta-delta slowing in bifrontal region which at times appeared sharply contoured.  Hyperventilation did not show any EEG change.  Physiologic photic driving was seen during photic stimulation.  ABNORMALITY -Intermittent slow,  bilateral frontal region IMPRESSION: This study is suggestive of nonspecific cortical dysfunction in bilateral frontal region. No seizures or definite epileptiform discharges were seen throughout the recording.    Procedures Procedures   Medications Ordered in ED Medications  sodium chloride 0.9 % bolus 1,000 mL (1,000 mLs Intravenous New Bag/Given 08/14/20 0800)    ED Course  I have reviewed the triage vital signs and the nursing notes.  Pertinent labs & imaging results that were available during my care of the patient were reviewed by me and considered in my medical decision making (see chart for details).  Clinical Course as of 08/14/20 1456  Wed Aug 14, 2020  1022 Neuro consult call returned by neurologist Dr. 08/16/20, however neuro provider had to end call due to incoming code stroke. I have secure messaged her requesting call back when she becomes available.  [RS]  1101 I spoke with Neuro APP 08/16/20, NP via telephone. Patient will be evaluated in the ED by Dr. Aug 16, 2020, who will offer formal recommendations. [RS]  1157 Dr. Iver Nestle evaluated the patient in the ED and recommends MRI brain and EEG to be performed prior to discharge. If normal, will d/c without anticonvulsants. If abnormalities identified, will d/c with Keppra. I appreciate her collaboration in the care of this patient.  [RS]  1450 Patient with normal MRI  of the brain, however abnormal EEG.  Per neurologist, Dr. Melynda RippleYadav, will discharge patient at this time with prescription for Keppra 500 mg twice daily, and close neurology follow-up outpatient.  I appreciate her extended collaboration care of this patient throughout her stay in the emergency department today. [RS]    Clinical Course User Index [RS] Alizeh Madril, Idelia Salmebekah R, PA-C   MDM Rules/Calculators/A&P                         25 year old female who presents via EMS after seizure at home.  Patient without history of seizures.  Differential  diagnosis for this patient's symptoms include hyper-/hypoglyemia, epileptic seizure, meningitis, encephalitis, toxic ingestion, drug withdrawal, intracranial mass.  Patient's vital signs are normal on intake. At the time of my initial exam patient is alert and oriented.  Cardiopulmonary exam is normal, neurologic exam is normal.  Patient is neurovascularly intact in all 4 extremities. Proceed with broad work-up given new seizure.  Patient without any seizure activity in the emergency department.  CBC with mild anemia hemoglobin 11.9, previously 1.5.  CMP unremarkable, mag normal, ethanol negative, patient is not pregnant.  UDS significant for tetrahydrocannabinol, UA with moderate amount of hemoglobin, otherwise unremarkable. CT of the head negative for acute intracranial abnormality.  EKG normal sinus rhythm, no STEMI.  Will proceed with neurology consult at this time.  Patient reevaluated. She remains alert and oriented.  Please see ED course above for more information on neurologic work-up.  Given primarily reassuring work-up today in the emergency department, no further work-up is warranted in the ED at this time. Will discharge with prescription for Keppra, per neurology recommendation following abnormal EEG.  Additionally recommend very close follow-up with neurology outpatient.  Rachelanne voiced understanding of her medical evaluation and treatment plan.  Each of her questions was answered to her expressed satisfaction.  Return precautions were given.  Patient is well-appearing, stable, and appropriate for discharge at this time.   This chart was dictated using voice recognition software, Dragon. Despite the best efforts of this provider to proofread and correct errors, errors may still occur which can change documentation meaning.  Final Clinical Impression(s) / ED Diagnoses Final diagnoses:  None    Rx / DC Orders ED Discharge Orders    None       Sherrilee GillesSponseller, Jarrius Huaracha R, PA-C 08/14/20  1456    Lorre NickAllen, Anthony, MD 08/16/20 [phone]

## 2020-08-14 NOTE — ED Triage Notes (Addendum)
Pt to ED via EMS from home, apparently pt was laying in bed with boyfriend and had a witnessed seizure lasting aprox 3 mins. No fall, postictal on EMS arrival, Pt more alert as time passed with EMS. Pt now A&OX4. Reports hx of seizure when an infant, but none since. Does not take any prescribed medication for seizures. #20 LAC. Last VS: 130/80, hr 80, cbg 143. No medications given by EMS.

## 2020-08-14 NOTE — ED Notes (Signed)
Pt transported to CT ?

## 2020-08-14 NOTE — Discharge Instructions (Signed)
You were evaluated in the emergency department today following your seizure at home this morning.  Your blood work, EKG, and CT and MRI of your head were very reassuring.  Unfortunately your EEG, which is the tracing of the electrical activity in your brain, had some abnormalities.  You have been prescribed medication called Keppra, which prevents seizures.  You should take this twice daily as prescribed.  Additionally you should call the neurology office listed below for follow-up later this week.  Additionally below are seizure precautions, which were reviewed with you by neurologist.  Return to the emergency department if you have any further seizures, if you lose consciousness, severe headaches, chest pain, difficulty breathing, or any other new severe symptoms.   Seizure precautions:  Per [facility] statutes, patients with seizures are not allowed to drive until they have been seizure-free for six months and cleared by a physician     Use caution when using heavy equipment or power tools. Avoid working on ladders or at heights. Take showers instead of baths. Ensure the water temperature is not too high on the home water heater. Do not go swimming alone. Do not lock yourself in a room alone (i.e. bathroom). When caring for infants or small children, sit down when holding, feeding, or changing them to minimize risk of injury to the child in the event you have a seizure. Maintain good sleep hygiene. Avoid alcohol.     If patient has another seizure, call 911 and bring them back to the ED if:  A.  The seizure lasts longer than 5 minutes.       B.  The patient doesn't wake shortly after the seizure or has new problems such as difficulty seeing, speaking or moving following the seizure  C.  The patient was injured during the seizure  D.  The patient has a temperature over 102 F (39C)  E.  The patient vomited during the seizure and now is having trouble breathing      During the Seizure      - First, ensure adequate ventilation and place patients on the floor on their left side  Loosen clothing around the neck and ensure the airway is patent. If the patient is clenching the teeth, do not force the mouth open with any object as this can cause severe damage  - Remove all items from the surrounding that can be hazardous. The patient may be oblivious to what's happening and may not even know what he or she is doing.  If the patient is confused and wandering, either gently guide him/her away and block access to outside areas  - Reassure the individual and be comforting  - Call 911. In most cases, the seizure ends before EMS arrives. However, there are cases when seizures may last over 3 to 5 minutes. Or the individual may have developed breathing difficulties or severe injuries. If a pregnant patient or a person with diabetes develops a seizure, it is prudent to call an ambulance.  - Finally, if the patient does not regain full consciousness, then call EMS. Most patients will remain confused for about 45 to 90 minutes after a seizure, so you must use judgment in calling for help.  - Avoid restraints but make sure the patient is in a bed with padded side rails  - Place the individual in a lateral position with the neck slightly flexed; this will help the saliva drain from the mouth and prevent the tongue from falling backward  -  Remove all nearby furniture and other hazards from the area  - Provide verbal assurance as the individual is regaining consciousness  - Provide the patient with privacy if possible  - Call for help and start treatment as ordered by the caregiver      After the Seizure (Postictal Stage)     After a seizure, most patients experience confusion, fatigue, muscle pain and/or a headache. Thus, one should permit the individual to sleep. For the next few days, reassurance is essential. Being calm and helping reorient the person is also of importance.     Most seizures are  painless and end spontaneously. Seizures are not harmful to others but can lead to complications such as stress on the lungs, brain and the heart. Individuals with prior lung problems may develop labored breathing and respiratory distress.

## 2020-08-14 NOTE — Consult Note (Signed)
Neurology Consultation Reason for Consult: Seizure Referring Physician: Dr. Lorre Nick  CC: Seizure  History is obtained from: Patient, mother at bedside  HPI: Alyssa Munoz is a 25 y.o. female with no significant past medical history who presented to the emergency department after a seizure-like episode.  Patient states she does not remember the episode.  Her boyfriend was with her this morning when he found her having generalized tonic-clonic seizure-like episode with eyes rolled back and foaming at mouth, lasting about 2 to 3 minutes, had tongue bite, denies urinary incontinence.  Patient's mother at bedside states patient had seizures after she was born for about 2 weeks and was on phenobarbital which was stopped when she was about 25 years old.  She did not have any seizures after phenobarbital was stopped until this morning.  Patient states she has been stressed and getting asleep for the last few days because her boyfriend has been sick with possible flu and she has a 18-year-old at home.  She also reports she has been trying to quit nicotine and has been vaping for the last few months which is led to worsening headaches.  Denies any other recent medication changes, trauma, illness,  denies history of absence seizures, staring spells, myoclonic jerks.  Epilepsy risk factors: Normal term vaginal delivery, had seizures for about 2 weeks after birth, patient's father had seizures briefly and has history of seizures on the paternal side of the family but does not know much history as they live in [place], denies head injury, neurosurgical interventions, developmental delay  ROS: All other systems reviewed and negative except as noted in the HPI.   Past Medical History:  Diagnosis Date  . Anemia   . Anxiety   . Chronic chest wall pain 2013  . Depression   . History of Wolff-Parkinson-White (WPW) syndrome    s/p ablation 2011 [facility] (Peds Cards Dr. Cristy Folks)  . Panic attack      Family History  Problem Relation Age of Onset  . Diabetes Maternal Grandmother   . Lung cancer Maternal Grandfather   . Sudden Cardiac Death Neg Hx     Social History:  reports that she quit smoking about 2 years ago. Her smoking use included cigarettes. She smoked 0.25 packs per day. She has never used smokeless tobacco. She reports previous drug use. Drug: Marijuana. She reports that she does not drink alcohol.  Exam: Current vital signs: BP (!) 114/91   Pulse (!) 41   Temp 98.2 F (36.8 C) (Oral)   Resp 15   Ht 5\' 3"  (1.6 m)   Wt 56.7 kg   SpO2 100%   BMI 22.14 kg/m  Vital signs in last 24 hours: Temp:  [98.2 F (36.8 C)] 98.2 F (36.8 C) (01/26 0727) Pulse Rate:  [41-68] 41 (01/26 1000) Resp:  [11-16] 15 (01/26 1000) BP: (112-125)/(69-91) 114/91 (01/26 1000) SpO2:  [98 %-100 %] 100 % (01/26 1000) Weight:  [56.7 kg] 56.7 kg (01/26 0724)   Physical Exam  Constitutional: Appears well-developed and well-nourished.  Psych: Affect appropriate to situation Eyes: No scleral injection HENT: No OP obstrucion Head: Normocephalic.  Cardiovascular: Normal rate and regular rhythm.  Respiratory: Effort normal, non-labored breathing GI: Soft.  No distension. There is no tenderness.  Skin: Warm Neuro: AOx3, cranial nerves II to XII grossly intact, 5/5 in upper extremities, FTN intact, intact to light touch, no sensory neglect, left side tongue bite   I have reviewed labs in epic and the  results pertinent to this consultation are: No leukocytosis, normal electrolytes, UDS positive for cannabis, no evidence of UTI.  I have reviewed the images obtained: CT head noncontrast 08/14/2020: No acute abnormality  ASSESSMENT/PLAN: 25 year old female with history of neonatal seizures presented with generalized tonic-clonic seizure-like episode.  Seizure -Given patient's prior history of neonatal seizures, family history of epilepsy, seizures out of sleep with left tongue bite,   patient is at high risk of seizure recurrence.   Recommendations: -We will order routine EEG to assess for potential epileptogenicity -We will order MRI brain without contrast to look for any structural abnormality  ADDENDUM -MRI brain without contrast did not show any acute abnormality -EEG showed nonspecific cortical dysfunction in bifrontal region.  -Discussed with the patient risk and benefits of starting AEDs.  Patient agreed to start Keppra 500 mg twice daily.  Also discussed potential teratogenicity of Keppra  -Discussed seizure precaution including do not drive for 6 months -Follow-up with neurology in 8 to 10 weeks  Thank you for allowing [place] to part spent in care of this patient.  Neurology will sign off.  Please call us for any further questions.  Lindie Spruce Epilepsy [facility]

## 2020-08-14 NOTE — Progress Notes (Signed)
EEG Completed; Results Pending  

## 2020-08-14 NOTE — Procedures (Signed)
Patient Name: Alyssa Munoz  MRN: [id number]  Epilepsy Attending: Charlsie Quest  Referring Physician/Provider: Dr. Lindie Spruce Date: 08/14/2020 Duration: 26.53 minutes  Patient history: 25 year old female with history of neonatal seizures now with generalized tonic-clonic seizure-like episode.  EEG to evaluate for seizures.  Level of alertness: Awake, asleep  AEDs during EEG study: None  Technical aspects: This EEG study was done with scalp electrodes positioned according to the 10-20 International system of electrode placement. Electrical activity was acquired at a sampling rate of 500Hz  and reviewed with a high frequency filter of 70Hz  and a low frequency filter of 1Hz . EEG data were recorded continuously and digitally stored.  Description: The posterior dominant rhythm consists of 10 Hz activity of moderate voltage (25-35 uV) seen predominantly in posterior head regions, symmetric and reactive to eye opening and eye closing. Sleep was characterized by vertex waves, sleep spindles (12 to 14 Hz), maximal frontocentral region.  EEG showed intermittent 3 to 5 Hz theta-delta slowing in bifrontal region which at times appeared sharply contoured.  Hyperventilation did not show any EEG change.  Physiologic photic driving was seen during photic stimulation.    ABNORMALITY -Intermittent slow, bilateral frontal region  IMPRESSION: This study is suggestive of nonspecific cortical dysfunction in bilateral frontal region. No seizures or definite epileptiform discharges were seen throughout the recording.  Treon Kehl 

## 2020-08-14 NOTE — ED Notes (Signed)
Patient verbalizes understanding of discharge instructions. Opportunity for questioning and answers were provided. Armband removed by staff, pt discharged from ED and ambulated to lobby to return home.   

## 2020-08-14 NOTE — ED Notes (Signed)
Pt transported to MRI 

## 2020-10-22 ENCOUNTER — Emergency Department (HOSPITAL_COMMUNITY)
Admission: EM | Admit: 2020-10-22 | Discharge: 2020-10-22 | Disposition: A | Discharge status: Home / Self Care | Payer: Medicaid Other | Attending: Emergency Medicine | Admitting: Emergency Medicine

## 2020-10-22 ENCOUNTER — Other Ambulatory Visit: Payer: Self-pay

## 2020-10-22 ENCOUNTER — Encounter (HOSPITAL_COMMUNITY): Payer: Self-pay

## 2020-10-22 DIAGNOSIS — R569 Unspecified convulsions: Secondary | ICD-10-CM

## 2020-10-22 DIAGNOSIS — S01551A Open bite of lip, initial encounter: Secondary | ICD-10-CM | POA: Insufficient documentation

## 2020-10-22 DIAGNOSIS — X58XXXA Exposure to other specified factors, initial encounter: Secondary | ICD-10-CM | POA: Insufficient documentation

## 2020-10-22 DIAGNOSIS — Z91040 Latex allergy status: Secondary | ICD-10-CM | POA: Diagnosis not present

## 2020-10-22 DIAGNOSIS — G40909 Epilepsy, unspecified, not intractable, without status epilepticus: Secondary | ICD-10-CM | POA: Diagnosis not present

## 2020-10-22 DIAGNOSIS — Z79899 Other long term (current) drug therapy: Secondary | ICD-10-CM | POA: Diagnosis not present

## 2020-10-22 DIAGNOSIS — Z87891 Personal history of nicotine dependence: Secondary | ICD-10-CM | POA: Insufficient documentation

## 2020-10-22 LAB — URINALYSIS, ROUTINE W REFLEX MICROSCOPIC
Bilirubin Urine: NEGATIVE
Glucose, UA: NEGATIVE mg/dL
Ketones, ur: NEGATIVE mg/dL
Leukocytes,Ua: NEGATIVE
Nitrite: NEGATIVE
Protein, ur: 30 mg/dL — AB
Specific Gravity, Urine: 1.018 (ref 1.005–1.030)
pH: 6 (ref 5.0–8.0)

## 2020-10-22 LAB — CBC WITH DIFFERENTIAL/PLATELET
Abs Immature Granulocytes: 0.03 10*3/uL (ref 0.00–0.07)
Basophils Absolute: 0 10*3/uL (ref 0.0–0.1)
Basophils Relative: 0 %
Eosinophils Absolute: 0.3 10*3/uL (ref 0.0–0.5)
Eosinophils Relative: 3 %
HCT: 39.4 % (ref 36.0–46.0)
Hemoglobin: 12.8 g/dL (ref 12.0–15.0)
Immature Granulocytes: 0 %
Lymphocytes Relative: 37 %
Lymphs Abs: 3 10*3/uL (ref 0.7–4.0)
MCH: 30.8 pg (ref 26.0–34.0)
MCHC: 32.5 g/dL (ref 30.0–36.0)
MCV: 94.9 fL (ref 80.0–100.0)
Monocytes Absolute: 0.5 10*3/uL (ref 0.1–1.0)
Monocytes Relative: 6 %
Neutro Abs: 4.4 10*3/uL (ref 1.7–7.7)
Neutrophils Relative %: 54 %
Platelets: 332 10*3/uL (ref 150–400)
RBC: 4.15 MIL/uL (ref 3.87–5.11)
RDW: 13 % (ref 11.5–15.5)
WBC: 8.1 10*3/uL (ref 4.0–10.5)
nRBC: 0 % (ref 0.0–0.2)

## 2020-10-22 LAB — COMPREHENSIVE METABOLIC PANEL
ALT: 14 U/L (ref 0–44)
AST: 21 U/L (ref 15–41)
Albumin: 4 g/dL (ref 3.5–5.0)
Alkaline Phosphatase: 54 U/L (ref 38–126)
Anion gap: 12 (ref 5–15)
BUN: 12 mg/dL (ref 6–20)
CO2: 21 mmol/L — ABNORMAL LOW (ref 22–32)
Calcium: 9.5 mg/dL (ref 8.9–10.3)
Chloride: 109 mmol/L (ref 98–111)
Creatinine, Ser: 0.72 mg/dL (ref 0.44–1.00)
GFR, Estimated: >60 mL/min (ref >60)
Glucose, Bld: 100 mg/dL — ABNORMAL HIGH (ref 70–99)
Potassium: 4.1 mmol/L (ref 3.5–5.1)
Sodium: 142 mmol/L (ref 135–145)
Total Bilirubin: 0.5 mg/dL (ref 0.3–1.2)
Total Protein: 7.7 g/dL (ref 6.5–8.1)

## 2020-10-22 LAB — CBG MONITORING, ED: Glucose-Capillary: 113 mg/dL — ABNORMAL HIGH (ref 70–99)

## 2020-10-22 MED ORDER — SODIUM CHLORIDE 0.9 % IV SOLN: INTRAVENOUS | Status: DC

## 2020-10-22 MED ORDER — SODIUM CHLORIDE 0.9 % IV BOLUS
1000.0000 mL | Freq: Once | INTRAVENOUS | Status: AC
  Administered 2020-10-22: 1000 mL via INTRAVENOUS

## 2020-10-22 NOTE — ED Triage Notes (Signed)
Pt BIB EMS Seizure prior to EMS arrival per boyfriend; lasted no longer than 3 minutes, full body. Last known seizure 3 months ago. No skipped medications. No visible injuries.   Vitals 102/56 bp  90 hr  98 room air  124 cbg  20 gauge left ac

## 2020-10-22 NOTE — ED Provider Notes (Signed)
[facility]-EMERGENCY DEPT Provider Note   CSN: [id number] Arrival date & time: 10/22/20  2051     History Chief Complaint  Patient presents with  . Seizures    Alyssa Munoz is a 25 y.o. female.  Pt presents to the ED today with a seizure.  She has a hx of seizures and is on keppra 500 mg bid.  She denies any skipped doses.  She does feel like she's been under a lot of stress and has not been sleeping well.  She did last see her neurologist on 3/25.  She had a normal EEG, but has a strong family hx.  Pt said she bit her lip and ground her teeth, but has no other injuries.         Past Medical History:  Diagnosis Date  . Anemia   . Anxiety   . Chronic chest wall pain 2013  . Depression   . History of Wolff-Parkinson-White (WPW) syndrome    s/p ablation 2011 [facility] (Peds Cards Dr. Cristy Folks)  . Panic attack     Patient Active Problem List   Diagnosis Date Noted  . Labor and delivery, indication for care 03/25/2018  . Group beta Strep positive 03/07/2018  . Abnormal antenatal test 09/27/2017  . Wolff-Parkinson-White (WPW) syndrome 09/17/2017  . Supervision of other normal pregnancy, antepartum 09/17/2017  . 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 H/O cardiac radiofrequency ablation 09/09/2012    Past Surgical History:  Procedure Laterality Date  . CARDIAC ELECTROPHYSIOLOGY STUDY AND ABLATION     wolffe parkinson syndrome     OB History    Gravida  1   Para  1   Term  1   Preterm      AB      Living  1     SAB      IAB      Ectopic      Multiple  0   Live Births  1           Family History  Problem Relation Age of Onset  . Diabetes Maternal Grandmother   . Lung cancer Maternal Grandfather   . Sudden Cardiac Death Neg Hx     Social History   Tobacco Use  . Smoking status: Former Smoker    Packs/day: 0.25    Types: Cigarettes    Quit date: 08/2017    Years  since quitting: 3.1  . Smokeless tobacco: Never Used  Vaping Use  . Vaping Use: Never used  Substance Use Topics  . Alcohol use: No  . Drug use: Not Currently    Types: Marijuana    Home Medications Prior to Admission medications   Medication Sig Start Date End Date Taking? Authorizing Provider  levETIRAcetam (KEPPRA) 500 MG tablet Take 1 tablet (500 mg total) by mouth 2 (two) times daily. 08/14/20 09/13/20 Yes Sponseller, Lupe Carney R, PA-C  triamcinolone cream (KENALOG) 0.1 % Apply 1 application topically 2 (two) times daily. 01/25/19   Janace Aris, NP    Allergies    Latex  Review of Systems   Review of Systems  Neurological: Positive for seizures.  All other systems reviewed and are negative.   Physical Exam Updated Vital Signs BP 113/62   Pulse 70   Temp 97.6 F (36.4 C) (Oral)   Resp 11   SpO2 99%   Physical Exam Vitals and nursing note reviewed.  Constitutional:  Appearance: Normal appearance.  HENT:     Head: Normocephalic and atraumatic.     Comments: Bite to right lower lip    Right Ear: External ear normal.     Left Ear: External ear normal.     Nose: Nose normal.     Mouth/Throat:     Mouth: Mucous membranes are moist.     Pharynx: Oropharynx is clear.  Eyes:     Extraocular Movements: Extraocular movements intact.     Conjunctiva/sclera: Conjunctivae normal.     Pupils: Pupils are equal, round, and reactive to light.  Cardiovascular:     Rate and Rhythm: Normal rate and regular rhythm.     Pulses: Normal pulses.     Heart sounds: Normal heart sounds.  Pulmonary:     Effort: Pulmonary effort is normal.     Breath sounds: Normal breath sounds.  Abdominal:     General: Abdomen is flat. Bowel sounds are normal.     Palpations: Abdomen is soft.  Musculoskeletal:        General: Normal range of motion.     Cervical back: Normal range of motion and neck supple.  Skin:    General: Skin is warm.     Capillary Refill: Capillary refill takes less  than 2 seconds.  Neurological:     General: No focal deficit present.     Mental Status: She is alert and oriented to person, place, and time.  Psychiatric:        Mood and Affect: Mood normal.        Behavior: Behavior normal.     ED Results / Procedures / Treatments   Labs (all labs ordered are listed, but only abnormal results are displayed) Labs Reviewed  COMPREHENSIVE METABOLIC PANEL - Abnormal; Notable for the following components:      Result Value   CO2 21 (*)    Glucose, Bld 100 (*)    All other components within normal limits  URINALYSIS, ROUTINE W REFLEX MICROSCOPIC - Abnormal; Notable for the following components:   Hgb urine dipstick MODERATE (*)    Protein, ur 30 (*)    Bacteria, UA RARE (*)    All other components within normal limits  CBG MONITORING, ED - Abnormal; Notable for the following components:   Glucose-Capillary 113 (*)    All other components within normal limits  CBC WITH DIFFERENTIAL/PLATELET  I-STAT BETA HCG BLOOD, ED ([facility], [facility], [facility] ONLY)    EKG EKG Interpretation  Date/Time:  Tuesday October 22 2020 21:07:28 EDT Ventricular Rate:  73 PR Interval:  140 QRS Duration: 85 QT Interval:  391 QTC Calculation: 431 R Axis:   51 Text Interpretation: Sinus rhythm ST elev, probable normal early repol pattern Confirmed by Jacalyn Lefevre [phone]) on 10/22/2020 9:47:51 PM   Radiology No results found.  Procedures Procedures   Medications Ordered in ED Medications  sodium chloride 0.9 % bolus 1,000 mL (1,000 mLs Intravenous New Bag/Given 10/22/20 2205)    And  0.9 %  sodium chloride infusion ( Intravenous New Bag/Given 10/22/20 2207)    ED Course  I have reviewed the triage vital signs and the nursing notes.  Pertinent labs & imaging results that were available during my care of the patient were reviewed by me and considered in my medical decision making (see chart for details).    MDM Rules/Calculators/A&P  Work up  unremarkable.  Pt encouraged to get enough sleep and to eat small, frequent meals.  Return if worse. F/u with neurology. Final Clinical Impression(s) / ED Diagnoses Final diagnoses:  Seizure [facility])    Rx / DC Orders ED Discharge Orders    None       Jacalyn Lefevre, MD 10/22/20 2334

## 2020-10-22 NOTE — ED Notes (Signed)
CBG value: 113 mg/dL

## 2020-10-24 ENCOUNTER — Other Ambulatory Visit: Payer: Self-pay

## 2020-10-24 ENCOUNTER — Encounter (HOSPITAL_COMMUNITY): Payer: Self-pay

## 2020-10-24 ENCOUNTER — Emergency Department (HOSPITAL_COMMUNITY)
Admission: EM | Admit: 2020-10-24 | Discharge: 2020-10-24 | Disposition: A | Discharge status: Home / Self Care | Payer: Medicaid Other | Attending: Emergency Medicine | Admitting: Emergency Medicine

## 2020-10-24 DIAGNOSIS — Z87891 Personal history of nicotine dependence: Secondary | ICD-10-CM | POA: Diagnosis not present

## 2020-10-24 DIAGNOSIS — K148 Other diseases of tongue: Secondary | ICD-10-CM | POA: Insufficient documentation

## 2020-10-24 DIAGNOSIS — R519 Headache, unspecified: Secondary | ICD-10-CM | POA: Insufficient documentation

## 2020-10-24 DIAGNOSIS — R569 Unspecified convulsions: Secondary | ICD-10-CM | POA: Diagnosis not present

## 2020-10-24 LAB — BASIC METABOLIC PANEL
Anion gap: 9 (ref 5–15)
BUN: 5 mg/dL — ABNORMAL LOW (ref 6–20)
CO2: 20 mmol/L — ABNORMAL LOW (ref 22–32)
Calcium: 9 mg/dL (ref 8.9–10.3)
Chloride: 109 mmol/L (ref 98–111)
Creatinine, Ser: 0.73 mg/dL (ref 0.44–1.00)
GFR, Estimated: >60 mL/min (ref >60)
Glucose, Bld: 92 mg/dL (ref 70–99)
Potassium: 3.9 mmol/L (ref 3.5–5.1)
Sodium: 138 mmol/L (ref 135–145)

## 2020-10-24 LAB — CBC
HCT: 38.1 % (ref 36.0–46.0)
Hemoglobin: 12.5 g/dL (ref 12.0–15.0)
MCH: 31 pg (ref 26.0–34.0)
MCHC: 32.8 g/dL (ref 30.0–36.0)
MCV: 94.5 fL (ref 80.0–100.0)
Platelets: 302 10*3/uL (ref 150–400)
RBC: 4.03 MIL/uL (ref 3.87–5.11)
RDW: 12.8 % (ref 11.5–15.5)
WBC: 7.9 10*3/uL (ref 4.0–10.5)
nRBC: 0 % (ref 0.0–0.2)

## 2020-10-24 LAB — I-STAT BETA HCG BLOOD, ED (MC, WL, AP ONLY): I-stat hCG, quantitative: <5 m[IU]/mL (ref <5)

## 2020-10-24 LAB — CBG MONITORING, ED: Glucose-Capillary: 84 mg/dL (ref 70–99)

## 2020-10-24 MED ORDER — LEVETIRACETAM 500 MG PO TABS
500.0000 mg | ORAL_TABLET | Freq: Once | ORAL | Status: AC
  Administered 2020-10-24: 500 mg via ORAL
  Filled 2020-10-24: qty 1

## 2020-10-24 NOTE — ED Triage Notes (Signed)
Pt was witnessed by her boy friend having a tonic clonic seizure while in bed this morning. EMS arrives and reports postictal period she was A/Ox1, before they left her residence she was A/Ox4, verbal, ambulatory & reports a ,id-frontal 8/10 HA & a bite to the left side of her tongue. Pt reports she had seizures as an infant & then 2 days ago she had her first one since childhood, she came in for eval & was given a script of Keppra & consult to Neuro, which she was to make an appointment with today. Upon arrival to ED pt remains A/Ox4, verbal- able to make needs known.  EMS V/S: 114/70, 80 bpm, 98%, 110 CBG, 97.7.

## 2020-10-24 NOTE — ED Notes (Signed)
Pt ambulated well to the Bathroom.

## 2020-10-24 NOTE — ED Provider Notes (Signed)
[facility] EMERGENCY DEPARTMENT Provider Note   CSN: [id number] Arrival date & time: 10/24/20  1100     History Chief Complaint  Patient presents with  . Seizures    Alyssa Munoz is a 25 y.o. female.  Patient with known history of seizures.  Followed by [facility] neurology.  Patient on Keppra 500 mg twice a day.  Did not have her morning dose today.  Because she had the seizure witnessed by her boyfriend and was brought in by EMS.  Patient now feels fine.  Says that the right side of her tongue is sore.  And she has a headache all very common post ictal for her.        Past Medical History:  Diagnosis Date  . Anemia   . Anxiety   . Chronic chest wall pain 2013  . Depression   . History of Wolff-Parkinson-White (WPW) syndrome    s/p ablation 2011 [facility] (Peds Cards Dr. Cristy Folks)  . Panic attack     Patient Active Problem List   Diagnosis Date Noted  . Labor and delivery, indication for care 03/25/2018  . Group beta Strep positive 03/07/2018  . Abnormal antenatal test 09/27/2017  . Wolff-Parkinson-White (WPW) syndrome 09/17/2017  . Supervision of other normal pregnancy, antepartum 09/17/2017  . 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 H/O cardiac radiofrequency ablation 09/09/2012    Past Surgical History:  Procedure Laterality Date  . CARDIAC ELECTROPHYSIOLOGY STUDY AND ABLATION     wolffe parkinson syndrome     OB History    Gravida  1   Para  1   Term  1   Preterm      AB      Living  1     SAB      IAB      Ectopic      Multiple  0   Live Births  1           Family History  Problem Relation Age of Onset  . Diabetes Maternal Grandmother   . Lung cancer Maternal Grandfather   . Sudden Cardiac Death Neg Hx     Social History   Tobacco Use  . Smoking status: Former Smoker    Packs/day: 0.25    Types: Cigarettes    Quit date: 08/2017    Years since  quitting: 3.1  . Smokeless tobacco: Never Used  Vaping Use  . Vaping Use: Never used  Substance Use Topics  . Alcohol use: No  . Drug use: Not Currently    Types: Marijuana    Home Medications Prior to Admission medications   Medication Sig Start Date End Date Taking? Authorizing Provider  AUROVELA 24 FE 1-20 MG-MCG(24) tablet Take 1 tablet by mouth at bedtime. 10/22/20  Yes [provider]  levETIRAcetam (KEPPRA) 500 MG tablet Take 500 mg by mouth 2 (two) times daily.   Yes [provider]  levETIRAcetam (KEPPRA) 500 MG tablet Take 1 tablet (500 mg total) by mouth 2 (two) times daily. 08/14/20 09/13/20  Sponseller, Lupe Carney R, PA-C  triamcinolone cream (KENALOG) 0.1 % Apply 1 application topically 2 (two) times daily. Patient not taking: Reported on 10/24/2020 01/25/19   Janace Aris, NP    Allergies    Latex  Review of Systems   Review of Systems  Constitutional: Negative for chills and fever.  HENT: Negative for rhinorrhea and sore throat.   Eyes: Negative for visual  disturbance.  Respiratory: Negative for cough and shortness of breath.   Cardiovascular: Negative for chest pain and leg swelling.  Gastrointestinal: Negative for abdominal pain, diarrhea, nausea and vomiting.  Genitourinary: Negative for dysuria.  Musculoskeletal: Negative for back pain and neck pain.  Skin: Negative for rash.  Neurological: Positive for seizures and headaches. Negative for dizziness and light-headedness.  Hematological: Does not bruise/bleed easily.  Psychiatric/Behavioral: Negative for confusion.    Physical Exam Updated Vital Signs BP 120/79   Pulse (!) 52   Temp 98.5 F (36.9 C) (Oral)   Resp 18   Ht 1.6 m (5\' 3" )   Wt 57.2 kg   SpO2 100%   BMI 22.32 kg/m   Physical Exam Vitals and nursing note reviewed.  Constitutional:      General: She is not in acute distress.    Appearance: Normal appearance. She is well-developed. She is not toxic-appearing.  HENT:      Head: Normocephalic and atraumatic.     Mouth/Throat:     Pharynx: Oropharynx is clear.     Comments: Patient with complaint of discomfort to the right side of the tongue.  No laceration. Eyes:     Extraocular Movements: Extraocular movements intact.     Conjunctiva/sclera: Conjunctivae normal.     Pupils: Pupils are equal, round, and reactive to light.  Cardiovascular:     Rate and Rhythm: Normal rate and regular rhythm.     Heart sounds: No murmur heard.   Pulmonary:     Effort: Pulmonary effort is normal. No respiratory distress.     Breath sounds: Normal breath sounds.  Abdominal:     Palpations: Abdomen is soft.     Tenderness: There is no abdominal tenderness.  Musculoskeletal:        General: No swelling. Normal range of motion.     Cervical back: Normal range of motion and neck supple. No rigidity.  Skin:    General: Skin is warm and dry.     Capillary Refill: Capillary refill takes less than 2 seconds.  Neurological:     General: No focal deficit present.     Mental Status: She is alert and oriented to person, place, and time.     Cranial Nerves: No cranial nerve deficit.     Sensory: No sensory deficit.     Motor: No weakness.     Coordination: Coordination normal.     ED Results / Procedures / Treatments   Labs (all labs ordered are listed, but only abnormal results are displayed) Labs Reviewed  BASIC METABOLIC PANEL - Abnormal; Notable for the following components:      Result Value   CO2 20 (*)    BUN 5 (*)    All other components within normal limits  CBC  CBG MONITORING, ED  I-STAT BETA HCG BLOOD, ED ([facility], [facility], [facility] ONLY)    EKG EKG Interpretation  Date/Time:  Thursday October 24 2020 11:07:20 EDT Ventricular Rate:  65 PR Interval:  135 QRS Duration: 88 QT Interval:  393 QTC Calculation: 409 R Axis:   43 Text Interpretation: Sinus rhythm ST elev, probable normal early repol pattern No significant change since last tracing Confirmed by 1972 [phone]) on 10/24/2020 12:16:16 PM   Radiology No results found.  Procedures Procedures   Medications Ordered in ED Medications  levETIRAcetam (KEPPRA) tablet 500 mg (500 mg Oral Given 10/24/20 1244)    ED Course  I have reviewed the triage vital signs and the nursing  notes.  Pertinent labs & imaging results that were available during my care of the patient were reviewed by me and considered in my medical decision making (see chart for details).    MDM Rules/Calculators/A&P                          Patient given 500 mg of Keppra here today a little delayed from her morning dose.  But she will restart with her home medications this evening.  Make an appointment to follow-up with her [facility] neurologist.  Chart review shows that patient's had several seizures here recently.  Neurology chart review shows that patient's EEGs have been normal but there is strong family history of seizures.  Patient currently stable stable for discharge home.  Labs without significant abnormalities.  No leukocytosis.  Pregnancy test negative.  Patient CO2 is slightly down consistent with perhaps post seizure activity.    Final Clinical Impression(s) / ED Diagnoses Final diagnoses:  Seizure [facility])    Rx / DC Orders ED Discharge Orders    None       Vanetta Mulders, MD 10/24/20 1338

## 2020-10-24 NOTE — Discharge Instructions (Signed)
Continue to take your Keppra as directed.  Make an appointment to follow back up with your neurologist in the [facility] system.  Return for any new or worse symptoms.  Work note provided.

## 2020-11-10 ENCOUNTER — Encounter (HOSPITAL_COMMUNITY): Payer: Self-pay

## 2020-11-10 ENCOUNTER — Emergency Department (HOSPITAL_COMMUNITY)
Admission: EM | Admit: 2020-11-10 | Discharge: 2020-11-10 | Disposition: A | Discharge status: Home / Self Care | Payer: Medicaid Other | Attending: Emergency Medicine | Admitting: Emergency Medicine

## 2020-11-10 DIAGNOSIS — Z91040 Latex allergy status: Secondary | ICD-10-CM | POA: Insufficient documentation

## 2020-11-10 DIAGNOSIS — G40909 Epilepsy, unspecified, not intractable, without status epilepticus: Secondary | ICD-10-CM | POA: Insufficient documentation

## 2020-11-10 DIAGNOSIS — R569 Unspecified convulsions: Secondary | ICD-10-CM | POA: Diagnosis present

## 2020-11-10 DIAGNOSIS — Z87891 Personal history of nicotine dependence: Secondary | ICD-10-CM | POA: Insufficient documentation

## 2020-11-10 LAB — I-STAT CHEM 8, ED
BUN: 6 mg/dL (ref 6–20)
Calcium, Ion: 1.17 mmol/L (ref 1.15–1.40)
Chloride: 107 mmol/L (ref 98–111)
Creatinine, Ser: 0.6 mg/dL (ref 0.44–1.00)
Glucose, Bld: 99 mg/dL (ref 70–99)
HCT: 34 % — ABNORMAL LOW (ref 36.0–46.0)
Hemoglobin: 11.6 g/dL — ABNORMAL LOW (ref 12.0–15.0)
Potassium: 3.8 mmol/L (ref 3.5–5.1)
Sodium: 140 mmol/L (ref 135–145)
TCO2: 25 mmol/L (ref 22–32)

## 2020-11-10 LAB — I-STAT BETA HCG BLOOD, ED (MC, WL, AP ONLY): I-stat hCG, quantitative: <5 m[IU]/mL (ref <5)

## 2020-11-10 MED ORDER — LEVETIRACETAM IN NACL 1000 MG/100ML IV SOLN
1000.0000 mg | Freq: Once | INTRAVENOUS | Status: AC
  Administered 2020-11-10: 1000 mg via INTRAVENOUS
  Filled 2020-11-10: qty 100

## 2020-11-10 MED ORDER — LEVETIRACETAM 500 MG PO TABS: 500.0000 mg | ORAL_TABLET | Freq: Two times a day (BID) | ORAL | 0 refills | Status: AC

## 2020-11-10 NOTE — ED Notes (Signed)
Pt d/c by  MD and is provided w/ d.c instructions and follow up care, pt out of ED ambulatory  

## 2020-11-10 NOTE — ED Triage Notes (Signed)
[facility] after having seizure. Pt with hx, ran out of Keppra last evening. Had seizure yesterday and today. Has appt with Neurology Friday.

## 2020-11-10 NOTE — Discharge Instructions (Addendum)
Continue to take your Keppra medication.  Follow-up with your neurologist as we discussed.  I have provided names of additional neurologist in this area as requested for a second opinion

## 2020-11-10 NOTE — ED Provider Notes (Signed)
[facility] EMERGENCY DEPARTMENT Provider Note   CSN: [id number] Arrival date & time: 11/10/20  1234     History Chief Complaint  Patient presents with  . Seizures    Alyssa Munoz is a 25 y.o. female.  HPI   Patient presents to the ED for evaluation of a seizure.  Patient ran out of her medications and took her last dose last evening.  Patient states she had a generalized seizure today.  This was similar to previous seizures that she has had.  Right now she is having headache but denies any other complaints.  She denies any fevers or chills.  No vomiting or diarrhea.  He denies any recent head injury.  EMS was called after the seizure she had today.  EMS did not witness any further seizures on route.  Patient states she has an appointment with her neurologist on Friday.  Past Medical History:  Diagnosis Date  . Anemia   . Anxiety   . Chronic chest wall pain 2013  . Depression   . History of Wolff-Parkinson-White (WPW) syndrome    s/p ablation 2011 [facility] (Peds Cards Dr. Cristy Folks)  . Panic attack     Patient Active Problem List   Diagnosis Date Noted  . Labor and delivery, indication for care 03/25/2018  . Group beta Strep positive 03/07/2018  . Abnormal antenatal test 09/27/2017  . Wolff-Parkinson-White (WPW) syndrome 09/17/2017  . Supervision of other normal pregnancy, antepartum 09/17/2017  . 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 H/O cardiac radiofrequency ablation 09/09/2012    Past Surgical History:  Procedure Laterality Date  . CARDIAC ELECTROPHYSIOLOGY STUDY AND ABLATION     wolffe parkinson syndrome     OB History    Gravida  1   Para  1   Term  1   Preterm      AB      Living  1     SAB      IAB      Ectopic      Multiple  0   Live Births  1           Family History  Problem Relation Age of Onset  . Diabetes Maternal Grandmother   . Lung cancer  Maternal Grandfather   . Sudden Cardiac Death Neg Hx     Social History   Tobacco Use  . Smoking status: Former Smoker    Packs/day: 0.25    Types: Cigarettes    Quit date: 08/2017    Years since quitting: 3.2  . Smokeless tobacco: Never Used  Vaping Use  . Vaping Use: Never used  Substance Use Topics  . Alcohol use: No  . Drug use: Not Currently    Types: Marijuana    Home Medications Prior to Admission medications   Medication Sig Start Date End Date Taking? Authorizing Provider  AUROVELA 24 FE 1-20 MG-MCG(24) tablet Take 1 tablet by mouth at bedtime. 10/22/20  Yes [provider]  MELATONIN PO Take 2 tablets by mouth at bedtime as needed (sleep).   Yes [provider]  levETIRAcetam (KEPPRA) 500 MG tablet Take 1 tablet (500 mg total) by mouth 2 (two) times daily. 11/10/20 12/10/20  Linwood Dibbles, MD    Allergies    Latex  Review of Systems   Review of Systems  All other systems reviewed and are negative.   Physical Exam Updated Vital Signs BP 105/68   Pulse [name])  57   Temp 98.4 F (36.9 C) (Oral)   Resp 16   SpO2 99%   Physical Exam Vitals and nursing note reviewed.  Constitutional:      General: She is not in acute distress.    Appearance: She is well-developed.  HENT:     Head: Normocephalic and atraumatic.     Right Ear: External ear normal.     Left Ear: External ear normal.  Eyes:     General: No scleral icterus.       Right eye: No discharge.        Left eye: No discharge.     Conjunctiva/sclera: Conjunctivae normal.  Neck:     Trachea: No tracheal deviation.  Cardiovascular:     Rate and Rhythm: Normal rate and regular rhythm.  Pulmonary:     Effort: Pulmonary effort is normal. No respiratory distress.     Breath sounds: Normal breath sounds. No stridor. No wheezing or rales.  Abdominal:     General: Bowel sounds are normal. There is no distension.     Palpations: Abdomen is soft.     Tenderness: There is no abdominal  tenderness. There is no guarding or rebound.  Musculoskeletal:        General: No tenderness.     Cervical back: Neck supple.  Skin:    General: Skin is warm and dry.     Findings: No rash.  Neurological:     Mental Status: She is alert.     Cranial Nerves: No cranial nerve deficit (no facial droop, extraocular movements intact, no slurred speech).     Sensory: No sensory deficit.     Motor: No abnormal muscle tone or seizure activity.     Coordination: Coordination normal.     ED Results / Procedures / Treatments   Labs (all labs ordered are listed, but only abnormal results are displayed) Labs Reviewed  I-STAT CHEM 8, ED - Abnormal; Notable for the following components:      Result Value   Hemoglobin 11.6 (*)    HCT 34.0 (*)    All other components within normal limits  LEVETIRACETAM LEVEL  I-STAT BETA HCG BLOOD, ED ([facility], [facility], [facility] ONLY)    EKG EKG Interpretation  Date/Time:  Sunday November 10 2020 12:46:00 EDT Ventricular Rate:  61 PR Interval:  136 QRS Duration: 83 QT Interval:  407 QTC Calculation: 410 R Axis:   43 Text Interpretation: Sinus rhythm ST elev, probable normal early repol pattern No significant change since last tracing Confirmed by Linwood Dibbles [phone]) on 11/10/2020 12:54:56 PM   Radiology No results found.  Procedures Procedures   Medications Ordered in ED Medications  levETIRAcetam (KEPPRA) IVPB 1000 mg/100 mL premix (0 mg Intravenous Stopped 11/10/20 1332)    ED Course  I have reviewed the triage vital signs and the nursing notes.  Pertinent labs & imaging results that were available during my care of the patient were reviewed by me and considered in my medical decision making (see chart for details).    MDM Rules/Calculators/A&P                         Previous records reviewed.  Patient has been seen several times in the past for seizures.  She was seen on April 5 as well as April 7.  Patient has seen a neurologist in March of this year.  She  saw Dr. Anne Hahn.  Patient has had normal EEGs but  has significant family history and her presentation was suggestive of seizure so she was started on Keppra  Patient's laboratory tests are unremarkable.  Patient is not pregnant.  Electrolytes are unremarkable.  Patient was given a dose of Keppra IV.  I discussed the findings with the patient as well as her mother.  They requested the names of additional her neurologist for a second opinion although they are very happy with the neurologist that they saw.  They were just concerned with her recurrent seizures.  At this point we will give her a prescription for her Keppra although she states she does have it available to pick up. Final Clinical Impression(s) / ED Diagnoses Final diagnoses:  Seizure disorder [facility])    Rx / DC Orders ED Discharge Orders         Ordered    levETIRAcetam (KEPPRA) 500 MG tablet  2 times daily        11/10/20 1528           Linwood Dibbles, MD 11/10/20 1529

## 2020-11-13 LAB — LEVETIRACETAM LEVEL: Levetiracetam Lvl: 57.7 ug/mL — ABNORMAL HIGH (ref 10.0–40.0)

## 2020-11-19 ENCOUNTER — Other Ambulatory Visit: Payer: Self-pay

## 2020-11-19 ENCOUNTER — Emergency Department (HOSPITAL_COMMUNITY)
Admission: EM | Admit: 2020-11-19 | Discharge: 2020-11-19 | Disposition: A | Discharge status: Home / Self Care | Payer: Medicaid Other | Attending: Emergency Medicine | Admitting: Emergency Medicine

## 2020-11-19 ENCOUNTER — Encounter (HOSPITAL_COMMUNITY): Payer: Self-pay | Admitting: Emergency Medicine

## 2020-11-19 DIAGNOSIS — Z91040 Latex allergy status: Secondary | ICD-10-CM | POA: Diagnosis not present

## 2020-11-19 DIAGNOSIS — R569 Unspecified convulsions: Secondary | ICD-10-CM | POA: Diagnosis present

## 2020-11-19 DIAGNOSIS — Z87891 Personal history of nicotine dependence: Secondary | ICD-10-CM | POA: Insufficient documentation

## 2020-11-19 DIAGNOSIS — G40909 Epilepsy, unspecified, not intractable, without status epilepticus: Secondary | ICD-10-CM

## 2020-11-19 LAB — BASIC METABOLIC PANEL
Anion gap: 5 (ref 5–15)
BUN: 10 mg/dL (ref 6–20)
CO2: 23 mmol/L (ref 22–32)
Calcium: 8.9 mg/dL (ref 8.9–10.3)
Chloride: 108 mmol/L (ref 98–111)
Creatinine, Ser: 0.71 mg/dL (ref 0.44–1.00)
GFR, Estimated: >60 mL/min (ref >60)
Glucose, Bld: 96 mg/dL (ref 70–99)
Potassium: 4.1 mmol/L (ref 3.5–5.1)
Sodium: 136 mmol/L (ref 135–145)

## 2020-11-19 LAB — CBC
HCT: 38.8 % (ref 36.0–46.0)
Hemoglobin: 12.7 g/dL (ref 12.0–15.0)
MCH: 31 pg (ref 26.0–34.0)
MCHC: 32.7 g/dL (ref 30.0–36.0)
MCV: 94.6 fL (ref 80.0–100.0)
Platelets: 328 10*3/uL (ref 150–400)
RBC: 4.1 MIL/uL (ref 3.87–5.11)
RDW: 12.9 % (ref 11.5–15.5)
WBC: 7.5 10*3/uL (ref 4.0–10.5)
nRBC: 0 % (ref 0.0–0.2)

## 2020-11-19 LAB — I-STAT BETA HCG BLOOD, ED (MC, WL, AP ONLY): I-stat hCG, quantitative: <5 m[IU]/mL (ref <5)

## 2020-11-19 NOTE — Discharge Instructions (Addendum)
Follow-up with your cardiologist as planned.  Continue evaluation with your neurologist.

## 2020-11-19 NOTE — ED Triage Notes (Signed)
Pt BIB [facility] c/o seizures. Pt has a hx of seizures, takes Keppra, dose increased 1 wk ago. Per pts boyfriend, pt had 2 seizures today, witnessed by boyfriend. Stated one was grand mal. Pt had 1 episode of emesis with EMS, given 4mg  zofran.   EMS HR- 115/64, HR 61, 98% RA, CBG 77

## 2020-11-19 NOTE — ED Provider Notes (Signed)
[facility] EMERGENCY DEPARTMENT Provider Note   CSN: [id number] Arrival date & time: 11/19/20  1905     History Chief Complaint  Patient presents with  . Seizures    Alyssa Munoz is a 25 y.o. female.  HPI   Pt presents to the ED for recurrent episode of possible seizure.  Pt is not sure what happened.  She did not call EMS.  Pt suspects that her boyfriend did.   Pt has history of WPW s/p ablation as well as possible seizure disorder.   Patient is taking Keppra and she has been compliant with her medications.  Patient has been seen in the ED several times in this past month.  She has been in the emergency room on April 5, the seventh ,24th and today.  Patient has seen her neurologist, Dr. Anne Hahn.  Patient's previous Keppra levels have been in the therapeutic range.  Patient cannot tell me any specific prodrome right now.  She is complaining of a headache however.  She denies any numbness or weakness.  No fevers or chills.  She does not have any chest pain or shortness of breath.  Past Medical History:  Diagnosis Date  . Anemia   . Anxiety   . Chronic chest wall pain 2013  . Depression   . History of Wolff-Parkinson-White (WPW) syndrome    s/p ablation 2011 [facility] (Peds Cards Dr. Cristy Folks)  . Panic attack     Patient Active Problem List   Diagnosis Date Noted  . Labor and delivery, indication for care 03/25/2018  . Group beta Strep positive 03/07/2018  . Abnormal antenatal test 09/27/2017  . Wolff-Parkinson-White (WPW) syndrome 09/17/2017  . Supervision of other normal pregnancy, antepartum 09/17/2017  . 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 H/O cardiac radiofrequency ablation 09/09/2012    Past Surgical History:  Procedure Laterality Date  . CARDIAC ELECTROPHYSIOLOGY STUDY AND ABLATION     wolffe parkinson syndrome     OB History    Gravida  1   Para  1   Term  1   Preterm      AB       Living  1     SAB      IAB      Ectopic      Multiple  0   Live Births  1           Family History  Problem Relation Age of Onset  . Diabetes Maternal Grandmother   . Lung cancer Maternal Grandfather   . Sudden Cardiac Death Neg Hx     Social History   Tobacco Use  . Smoking status: Former Smoker    Packs/day: 0.25    Types: Cigarettes    Quit date: 08/2017    Years since quitting: 3.2  . Smokeless tobacco: Never Used  Vaping Use  . Vaping Use: Never used  Substance Use Topics  . Alcohol use: No  . Drug use: Not Currently    Types: Marijuana    Home Medications Prior to Admission medications   Medication Sig Start Date End Date Taking? Authorizing Provider  AUROVELA 24 FE 1-20 MG-MCG(24) tablet Take 1 tablet by mouth at bedtime. 10/22/20   [provider]  levETIRAcetam (KEPPRA) 500 MG tablet Take 1 tablet (500 mg total) by mouth 2 (two) times daily. 11/10/20 12/10/20  Linwood Dibbles, MD  MELATONIN PO Take 2 tablets by mouth at bedtime as needed (  sleep).    [provider]    Allergies    Latex  Review of Systems   Review of Systems  All other systems reviewed and are negative.   Physical Exam Updated Vital Signs BP 111/83   Pulse (!) 59   Temp 98.1 F (36.7 C)   Resp 16   Ht 1.575 m (5\' 2" )   Wt 57.6 kg   SpO2 100%   BMI 23.23 kg/m   Physical Exam Vitals and nursing note reviewed.  Constitutional:      General: She is not in acute distress.    Appearance: She is well-developed.  HENT:     Head: Normocephalic and atraumatic.     Right Ear: External ear normal.     Left Ear: External ear normal.  Eyes:     General: No scleral icterus.       Right eye: No discharge.        Left eye: No discharge.     Conjunctiva/sclera: Conjunctivae normal.  Neck:     Trachea: No tracheal deviation.  Cardiovascular:     Rate and Rhythm: Normal rate and regular rhythm.  Pulmonary:     Effort: Pulmonary effort is normal. No respiratory  distress.     Breath sounds: Normal breath sounds. No stridor. No wheezing or rales.  Abdominal:     General: Bowel sounds are normal. There is no distension.     Palpations: Abdomen is soft.     Tenderness: There is no abdominal tenderness. There is no guarding or rebound.  Musculoskeletal:        General: No tenderness.     Cervical back: Neck supple.  Skin:    General: Skin is warm and dry.     Findings: No rash.  Neurological:     Mental Status: She is alert.     Cranial Nerves: No cranial nerve deficit (no facial droop, extraocular movements intact, no slurred speech).     Sensory: No sensory deficit.     Motor: No abnormal muscle tone or seizure activity.     Coordination: Coordination normal.     ED Results / Procedures / Treatments   Labs (all labs ordered are listed, but only abnormal results are displayed) Labs Reviewed  CBC  BASIC METABOLIC PANEL  LEVETIRACETAM LEVEL  I-STAT BETA HCG BLOOD, ED ([facility], [facility], [facility] ONLY)    EKG None EKG Sinus bradycardia rate 58 Normal intervals Normal ST-T waves  Radiology No results found.  Procedures Procedures   Medications Ordered in ED Medications - No data to display  ED Course  I have reviewed the triage vital signs and the nursing notes.  Pertinent labs & imaging results that were available during my care of the patient were reviewed by me and considered in my medical decision making (see chart for details).  Clinical Course as of 11/19/20 2138  Tue Nov 19, 2020  2137 Patient's laboratory tests have been reviewed.  CBC metabolic panel normal.  Pregnancy test is negative. [JK]  2137   Keppra level has been sent off. [JK]  2137 Patient is feeling well.  She states she has to go home and take care of her daughter. [JK]    Clinical Course User Index [JK] 2138, MD   MDM Rules/Calculators/A&P                          Patient unfortunately presents with recurrent episode felt to be seizures  but unfortunately I  have not been able to speak of her decision who witnessed the event.  There are no other family members here with the patient.  Patient has not had any further activity while she is here in the ED.  She has been seeing her neurologist.  There are concerns about whether she could be having a cardiac etiology to these episodes as opposed to epileptic activity.  Patient does have a prior history of WPW.  EKG does not show any acute abnormalities today.  Patient does appear stable for continued outpatient evaluation with her cardiologist and neurologist. Final Clinical Impression(s) / ED Diagnoses Final diagnoses:  Seizure disorder [facility])    Rx / DC Orders ED Discharge Orders    None       Linwood Dibbles, MD 11/19/20 2138

## 2020-11-22 LAB — LEVETIRACETAM LEVEL: Levetiracetam Lvl: 13.8 ug/mL (ref 10.0–40.0)

## 2020-12-07 ENCOUNTER — Emergency Department (HOSPITAL_COMMUNITY)
Admission: EM | Admit: 2020-12-07 | Discharge: 2020-12-07 | Disposition: A | Discharge status: Home / Self Care | Payer: Medicaid Other | Attending: Emergency Medicine | Admitting: Emergency Medicine

## 2020-12-07 ENCOUNTER — Encounter (HOSPITAL_COMMUNITY): Payer: Self-pay | Admitting: Emergency Medicine

## 2020-12-07 ENCOUNTER — Other Ambulatory Visit: Payer: Self-pay

## 2020-12-07 DIAGNOSIS — Z91040 Latex allergy status: Secondary | ICD-10-CM | POA: Insufficient documentation

## 2020-12-07 DIAGNOSIS — R42 Dizziness and giddiness: Secondary | ICD-10-CM | POA: Insufficient documentation

## 2020-12-07 DIAGNOSIS — R519 Headache, unspecified: Secondary | ICD-10-CM | POA: Insufficient documentation

## 2020-12-07 DIAGNOSIS — I456 Pre-excitation syndrome: Secondary | ICD-10-CM | POA: Diagnosis not present

## 2020-12-07 DIAGNOSIS — Z87891 Personal history of nicotine dependence: Secondary | ICD-10-CM | POA: Insufficient documentation

## 2020-12-07 DIAGNOSIS — R569 Unspecified convulsions: Secondary | ICD-10-CM | POA: Insufficient documentation

## 2020-12-07 DIAGNOSIS — R251 Tremor, unspecified: Secondary | ICD-10-CM | POA: Insufficient documentation

## 2020-12-07 DIAGNOSIS — Z79899 Other long term (current) drug therapy: Secondary | ICD-10-CM | POA: Diagnosis not present

## 2020-12-07 LAB — CBC WITH DIFFERENTIAL/PLATELET
Abs Immature Granulocytes: 0.02 10*3/uL (ref 0.00–0.07)
Basophils Absolute: 0.1 10*3/uL (ref 0.0–0.1)
Basophils Relative: 1 %
Eosinophils Absolute: 0.2 10*3/uL (ref 0.0–0.5)
Eosinophils Relative: 3 %
HCT: 38.6 % (ref 36.0–46.0)
Hemoglobin: 12.8 g/dL (ref 12.0–15.0)
Immature Granulocytes: 0 %
Lymphocytes Relative: 30 %
Lymphs Abs: 2.1 10*3/uL (ref 0.7–4.0)
MCH: 31.3 pg (ref 26.0–34.0)
MCHC: 33.2 g/dL (ref 30.0–36.0)
MCV: 94.4 fL (ref 80.0–100.0)
Monocytes Absolute: 0.4 10*3/uL (ref 0.1–1.0)
Monocytes Relative: 5 %
Neutro Abs: 4.1 10*3/uL (ref 1.7–7.7)
Neutrophils Relative %: 61 %
Platelets: 305 10*3/uL (ref 150–400)
RBC: 4.09 MIL/uL (ref 3.87–5.11)
RDW: 13.1 % (ref 11.5–15.5)
WBC: 6.9 10*3/uL (ref 4.0–10.5)
nRBC: 0 % (ref 0.0–0.2)

## 2020-12-07 LAB — BASIC METABOLIC PANEL
Anion gap: 4 — ABNORMAL LOW (ref 5–15)
BUN: 11 mg/dL (ref 6–20)
CO2: 24 mmol/L (ref 22–32)
Calcium: 9.2 mg/dL (ref 8.9–10.3)
Chloride: 109 mmol/L (ref 98–111)
Creatinine, Ser: 0.67 mg/dL (ref 0.44–1.00)
GFR, Estimated: >60 mL/min (ref >60)
Glucose, Bld: 85 mg/dL (ref 70–99)
Potassium: 4.4 mmol/L (ref 3.5–5.1)
Sodium: 137 mmol/L (ref 135–145)

## 2020-12-07 LAB — I-STAT BETA HCG BLOOD, ED (MC, WL, AP ONLY): I-stat hCG, quantitative: <5 m[IU]/mL (ref <5)

## 2020-12-07 LAB — CBG MONITORING, ED: Glucose-Capillary: 88 mg/dL (ref 70–99)

## 2020-12-07 NOTE — Discharge Instructions (Signed)
Follow up with your neurologist and cardiologist as we discussed.   Per [facility] statutes, patients with seizures are not allowed to drive until  they have been seizure-free for six months. Use caution when using heavy equipment or power tools. Avoid working on ladders or at heights. Take showers instead of baths. Ensure the water temperature is not too high on the home water heater. Do not go swimming alone. When caring for infants or small children, sit down when holding, feeding, or changing them to minimize risk of injury to the child in the event you have a seizure.   Also, Maintain good sleep hygiene. Avoid alcohol.

## 2020-12-07 NOTE — ED Triage Notes (Signed)
Patient BIBA from home d/t seizure witnessed by boyfriend which lasted approx 3 minutes. Patient has hx of grand mal seizures. Currently on Keppra, had recent in dose frequency from every 10 hours to every 8 hours (per boyfriend report). Boyfriend also reports patient did fall. VS WDL.  22G RAC

## 2020-12-07 NOTE — ED Provider Notes (Signed)
[facility]   CSN: [id number] Arrival date & time: 12/07/20  1056     History Chief Complaint  Patient presents with  . Seizures    Alyssa Munoz is a 25 y.o. female with past medical history significant for anemia, chronic chest wall pain, Wolff-Parkinson-White syndrome s/p radioablation in 2011, seizures.  Neurologist is Dr. Anne Hahn.  HPI Patient presents to emergency department today with chief complaint of seizure.  Patient states this happened just prior to arrival.  She states she was in the bathroom and when she was walking out of the bathroom she felt light headed and called out to her boyfriend. She denies any chest pain or shortness of breath. He came to see she needed and saw patient start to fall. He was able to catch her and lay her down gently on the floor. She had seizure for approximately 3 minutes. Her typical seizure activity is stiffening of both arms and full body shaking. She denies oral injury or bladder incontinence. Her boyfriend states it was her typical seizure activity. The next thing the patient remembers is waking up in the ambulance. She admits to having a headache now, pain is located on the back of head and feels like throbbing. She rates pain 2/10 in severity. It has worsened since onset and feels like headaches she has had in the past. Denies any associated neck pain or stiffness. Patient states yesterday she was busy running errands and cleaning her house so she did not eat or drink much throughout the day. She thinks that might have caused seizure. She does admit to increased stress lately. She admits to good sleep hygiene. She denies any drug use. She denies any fever, chills, visual changes, numbness, weakness. No recent illness.  She admits to compliance with keppra. She last saw neurology 11/15/20 and her keppra was increased and melatonin discontinued. She reports compliance with keppra. She also states she  has an upcoming appointment with cardiology to evaluate recent syncope. Patient last saw cardiologist approximately 3 years ago and was reared by them per patient.  Past Medical History:  Diagnosis Date  . Anemia   . Anxiety   . Chronic chest wall pain 2013  . Depression   . History of Wolff-Parkinson-White (WPW) syndrome    s/p ablation 2011 [facility] (Peds Cards Dr. Cristy Folks)  . Panic attack     Patient Active Problem List   Diagnosis Date Noted  . Labor and delivery, indication for care 03/25/2018  . Group beta Strep positive 03/07/2018  . Abnormal antenatal test 09/27/2017  . Wolff-Parkinson-White (WPW) syndrome 09/17/2017  . Supervision of other normal pregnancy, antepartum 09/17/2017  . 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 H/O cardiac radiofrequency ablation 09/09/2012    Past Surgical History:  Procedure Laterality Date  . CARDIAC ELECTROPHYSIOLOGY STUDY AND ABLATION     wolffe parkinson syndrome     OB History    Gravida  1   Para  1   Term  1   Preterm      AB      Living  1     SAB      IAB      Ectopic      Multiple  0   Live Births  1           Family History  Problem Relation Age of Onset  . Diabetes Maternal Grandmother   . Lung cancer  Maternal Grandfather   . Sudden Cardiac Death Neg Hx     Social History   Tobacco Use  . Smoking status: Former Smoker    Packs/day: 0.25    Types: Cigarettes    Quit date: 08/2017    Years since quitting: 3.3  . Smokeless tobacco: Never Used  Vaping Use  . Vaping Use: Never used  Substance Use Topics  . Alcohol use: No  . Drug use: Not Currently    Types: Marijuana    Home Medications Prior to Admission medications   Medication Sig Start Date End Date Taking? Authorizing Provider  AUROVELA 24 FE 1-20 MG-MCG(24) tablet Take 1 tablet by mouth at bedtime. 10/22/20  Yes [provider]  levETIRAcetam (KEPPRA) 500 MG tablet Take  1 tablet (500 mg total) by mouth 2 (two) times daily. 11/10/20 12/10/20 Yes Linwood Dibbles, MD  MELATONIN PO Take 2 tablets by mouth at bedtime as needed (sleep).   Yes [provider]    Allergies    Latex  Review of Systems   Review of Systems All other systems are reviewed and are negative for acute change except as noted in the HPI.  Physical Exam Updated Vital Signs BP 115/68 (BP Location: Right Arm)   Pulse 73   Temp 98.2 F (36.8 C) (Oral)   Resp 13   Ht 5\' 2"  (1.575 m)   Wt 59 kg   SpO2 100%   BMI 23.78 kg/m   Physical Exam Vitals and nursing note reviewed.  Constitutional:      General: She is not in acute distress.    Appearance: She is not ill-appearing.  HENT:     Head: Normocephalic and atraumatic.     Comments: No tenderness to palpation of skull. No deformities or crepitus noted. No open wounds, abrasions or lacerations.    Right Ear: Tympanic membrane and external ear normal.     Left Ear: Tympanic membrane and external ear normal.     Nose: Nose normal.     Mouth/Throat:     Mouth: Mucous membranes are moist.     Pharynx: Oropharynx is clear.     Comments: No oral injury Eyes:     General: No scleral icterus.       Right eye: No discharge.        Left eye: No discharge.     Extraocular Movements: Extraocular movements intact.     Conjunctiva/sclera: Conjunctivae normal.     Pupils: Pupils are equal, round, and reactive to light.  Neck:     Vascular: No JVD.     Comments: Full ROM intact without spinous process TTP. No bony stepoffs or deformities, no paraspinous muscle TTP or muscle spasms. No rigidity or meningeal signs. No bruising, erythema, or swelling.  Cardiovascular:     Rate and Rhythm: Normal rate and regular rhythm.     Pulses: Normal pulses.          Radial pulses are 2+ on the right side and 2+ on the left side.     Heart sounds: Normal heart sounds. No murmur heard.   Pulmonary:     Comments: Lungs clear to auscultation in all  fields. Symmetric chest rise. No wheezing, rales, or rhonchi. Abdominal:     Comments: Abdomen is soft, non-distended, and non-tender in all quadrants. No rigidity, no guarding. No peritoneal signs.  Musculoskeletal:        General: Normal range of motion.     Cervical back: Normal range  of motion.  Skin:    General: Skin is warm and dry.     Capillary Refill: Capillary refill takes less than 2 seconds.  Neurological:     Mental Status: She is oriented to person, place, and time.     GCS: GCS eye subscore is 4. GCS verbal subscore is 5. GCS motor subscore is 6.     Comments: Speech is clear and goal oriented, follows commands CN III-XII intact, no facial droop Normal strength in upper and lower extremities bilaterally including dorsiflexion and plantar flexion, strong and equal grip strength Sensation normal to light and sharp touch Moves extremities without ataxia, coordination intact Normal finger to nose and rapid alternating movements Normal gait and balance   Psychiatric:        Behavior: Behavior normal.     ED Results / Procedures / Treatments   Labs (all labs ordered are listed, but only abnormal results are displayed) Labs Reviewed  BASIC METABOLIC PANEL - Abnormal; Notable for the following components:      Result Value   Anion gap 4 (*)    All other components within normal limits  CBC WITH DIFFERENTIAL/PLATELET  CBC WITH DIFFERENTIAL/PLATELET  LEVETIRACETAM LEVEL  CBG MONITORING, ED  CBG MONITORING, ED  I-STAT BETA HCG BLOOD, ED ([facility], [facility], [facility] ONLY)    EKG EKG Interpretation  Date/Time:  Saturday Dec 07 2020 11:05:24 EDT Ventricular Rate:  62 PR Interval:  141 QRS Duration: 88 QT Interval:  389 QTC Calculation: 395 R Axis:   57 Text Interpretation: Sinus arrhythmia ST elev, probable normal early repol pattern since last tracing no significant change Confirmed by Mancel Bale [phone]) on 12/07/2020 1:12:49 PM   Radiology No results  found.  Procedures Procedures   Medications Ordered in ED Medications - No data to display  ED Course  I have reviewed the triage vital signs and the nursing notes.  Pertinent labs & imaging results that were available during my care of the patient were reviewed by me and considered in my medical decision making (see chart for details).    MDM Rules/Calculators/A&P                          History provided by patient with additional history obtained from chart review.    Presenting after witnessed seizure. At baseline on ED arrival. Glucose 88. Afebrile, VSS. Normal neuro exam. EKG without ischemic changes. Patient is eager to get home to to take care of her children. She does agree to basic lab work, checking keppra level and brief observation. Engaged in shared decision making with patient regarding head CT. She did not strike head and has no signs of head or neck injury, she would like to hold off on imaging at this time which I feel is reasonable. She has history of WPW. No prodrome of CP or palpitations. Seems unlikely to be syncopal episode today. Patient on cardiac monitor while in the ED and no arrhythmia seen. Patient given a snack. She was observed for 3 hours in the ED. She has serial normal neuro exams. CBC and BMP overall unremarkable. Pregnancy test is negative. Patient discharged home in stable condition with plan of close follow up with her neurologist and cardiology as already scheduled. Seizure precautions discussed. Strict return precautions discussed.   Portions of this note were generated with Scientist, clinical (histocompatibility and immunogenetics). Dictation errors may occur despite best attempts at proofreading.     Final Clinical Impression(s) /  ED Diagnoses Final diagnoses:  Seizure [facility](HCC)    Rx / DC Orders ED Discharge Orders    None       Kandice HamsWalisiewicz, Indy Prestwood E, PA-C 12/07/20 2131    Mancel BaleWentz, Elliott, MD 12/08/20 [phone]

## 2020-12-12 LAB — LEVETIRACETAM LEVEL: Levetiracetam Lvl: 16.1 ug/mL (ref 10.0–40.0)

## 2022-07-03 IMAGING — CT CT HEAD W/O CM
4 series · 16 of 47 positions shown, 18 images · non-contrast
Comparison: None.

CLINICAL DATA: Nontraumatic seizure

EXAM:
CT HEAD WITHOUT CONTRAST
TECHNIQUE: Contiguous axial images were obtained from the base of the skull
through the vertex without intravenous contrast.

[Series 3: head without · axial · non-contrast · 0.42mm/px · z∈[+1395,+1505]mm · 7 of 30 slices shown, 9 images]
[im 4/30  brain]
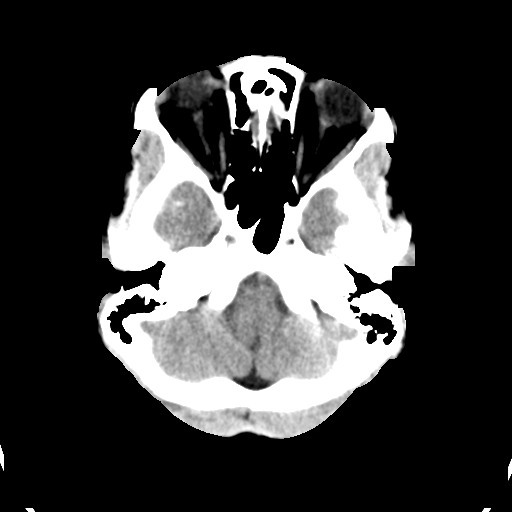
[im 4/30  bone]
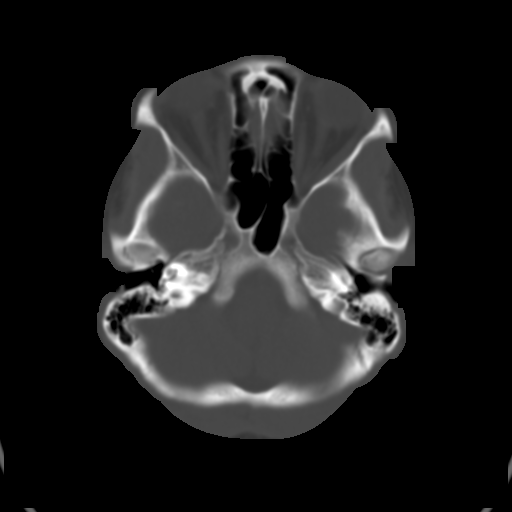
[im 8/30  brain]
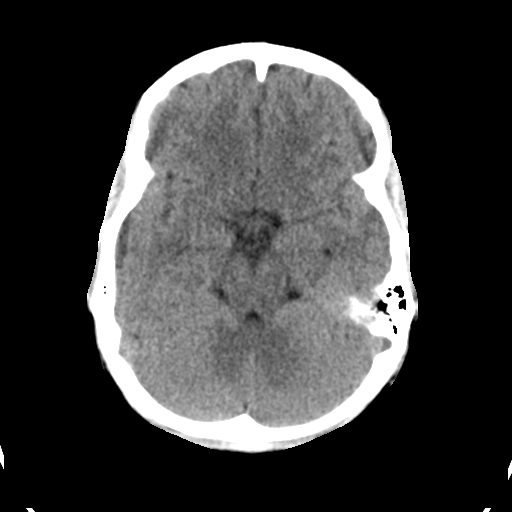
[im 11/30  brain]
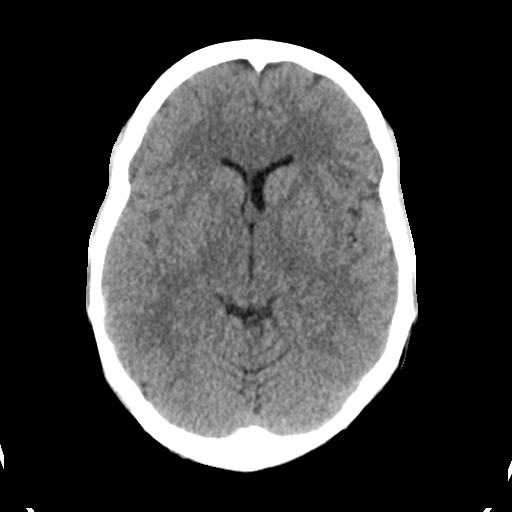
[im 15/30  brain]
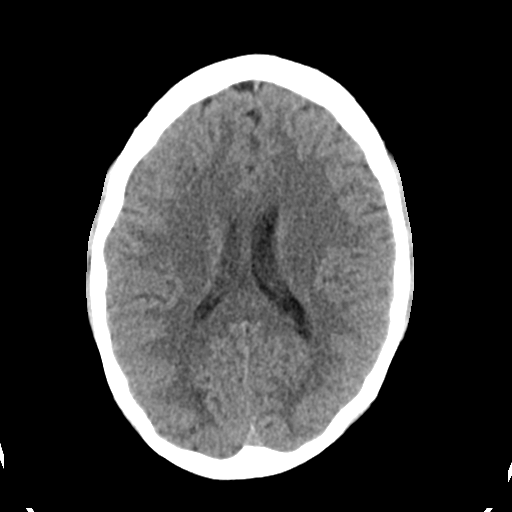
[im 19/30  brain]
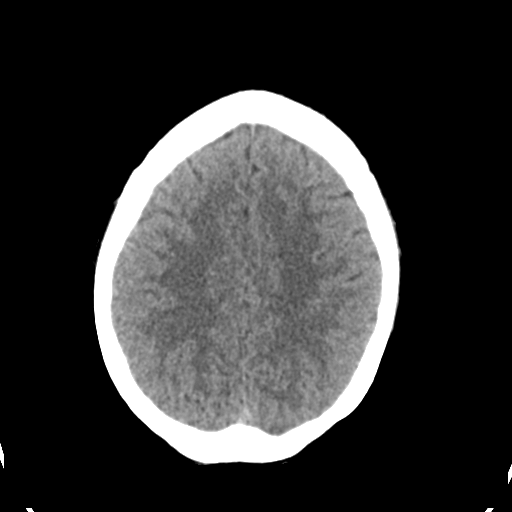
[im 19/30  bone]
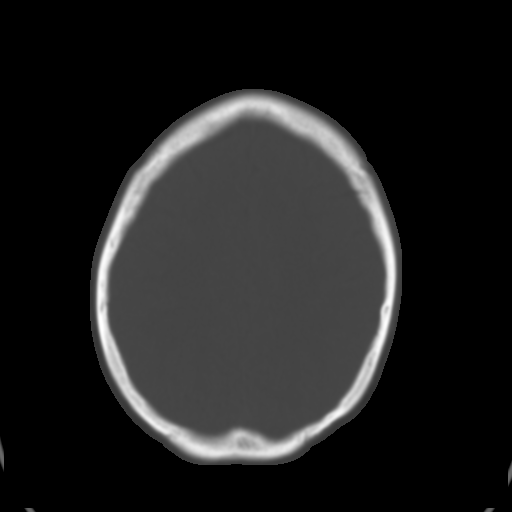
[im 22/30  brain]
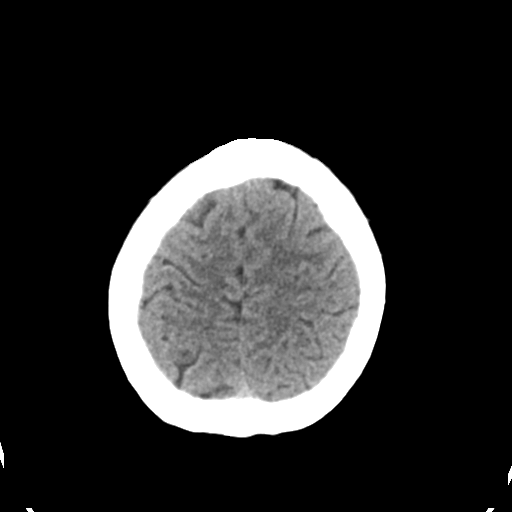
[im 26/30  brain]
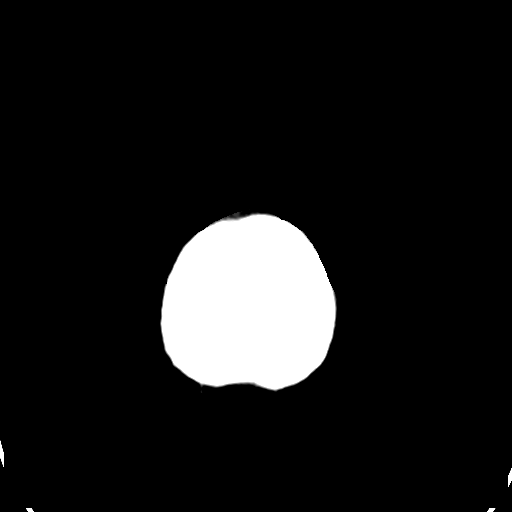

[Series 4: head bone · axial · 0.42mm/px · z∈[+1394,+1422]mm · 3 of 74 slices shown]
[im 8/74  bone]
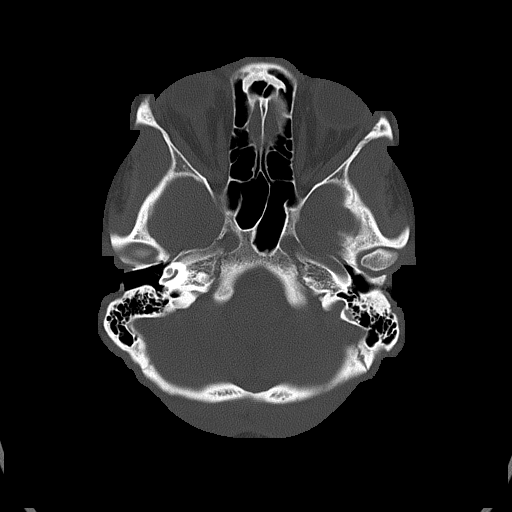
[im 15/74  bone]
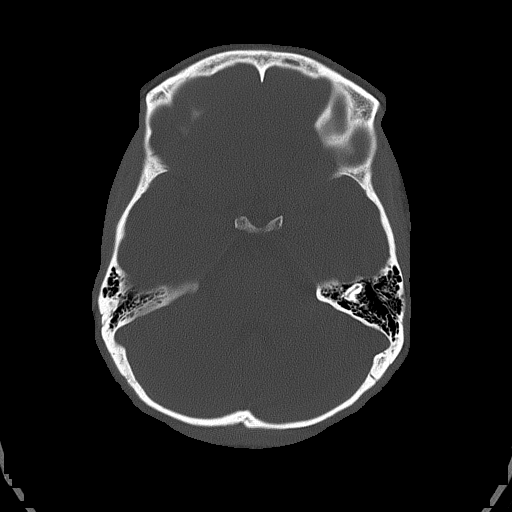
[im 22/74  bone]
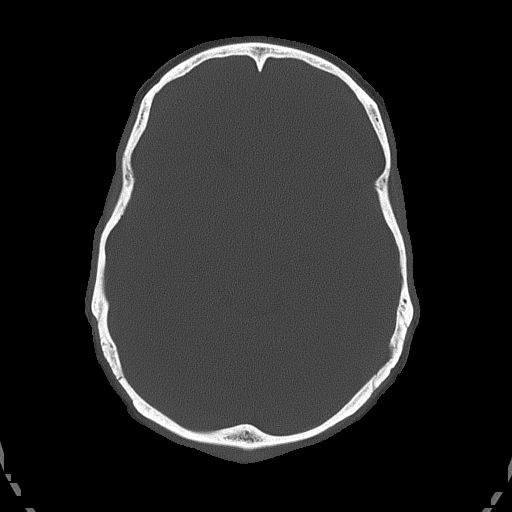

[Series 5: head without cor · coronal · non-contrast · 0.30mm/px · 3 of 63 slices shown]
[im 21/63  brain]
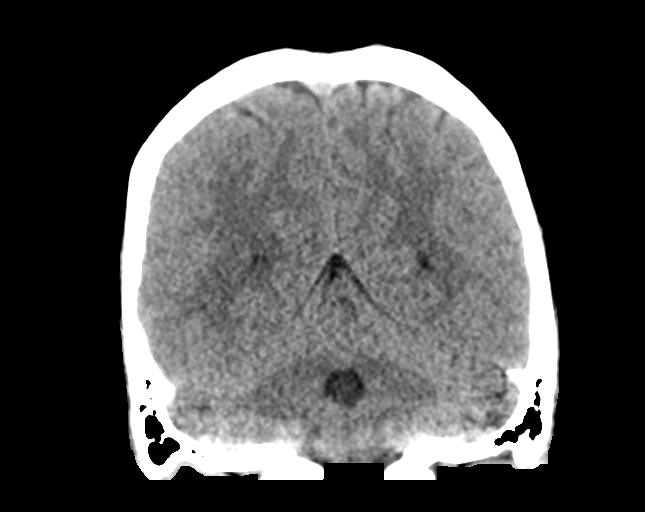
[im 28/63  brain]
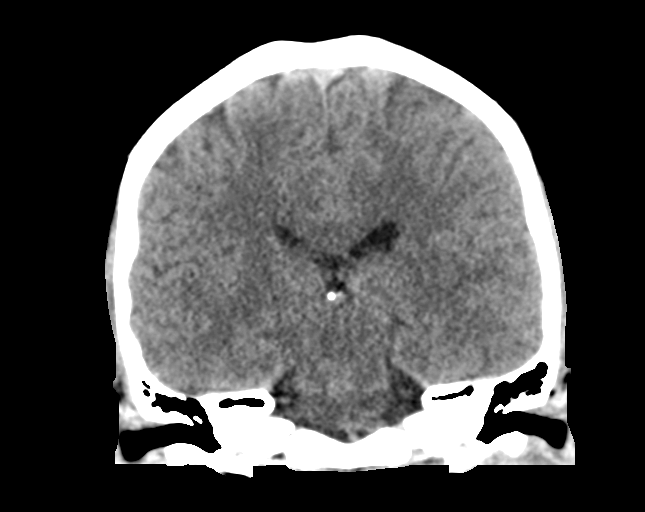
[im 35/63  brain]
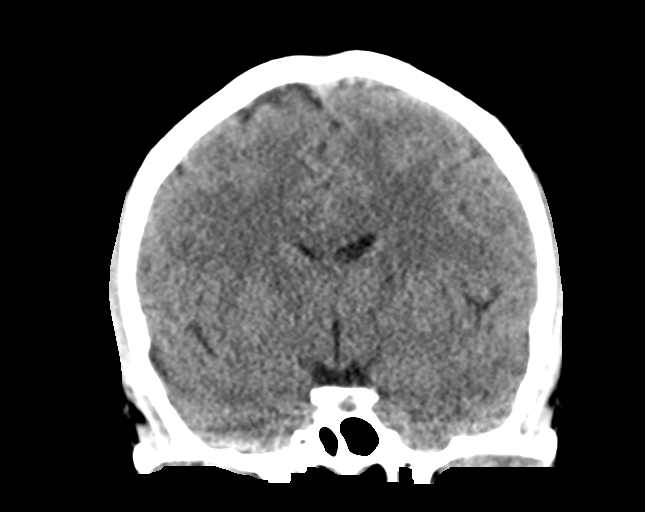

[Series 6: head without sag · sagittal · non-contrast · 0.28mm/px · 3 of 51 slices shown]
[im 17/51  brain]
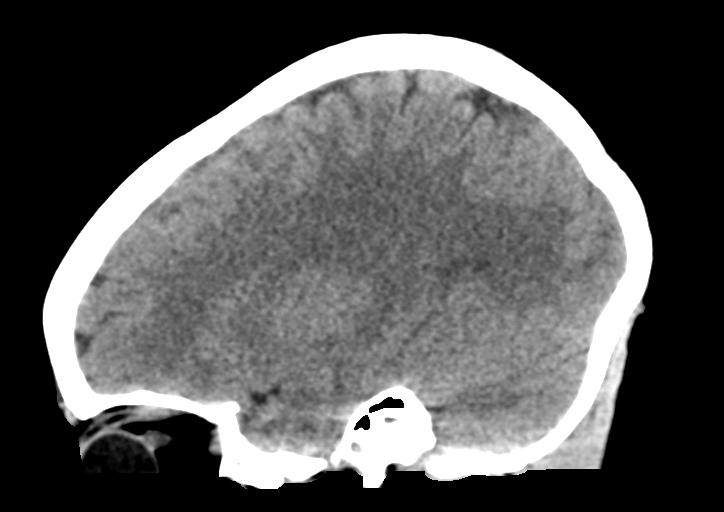
[im 26/51  brain]
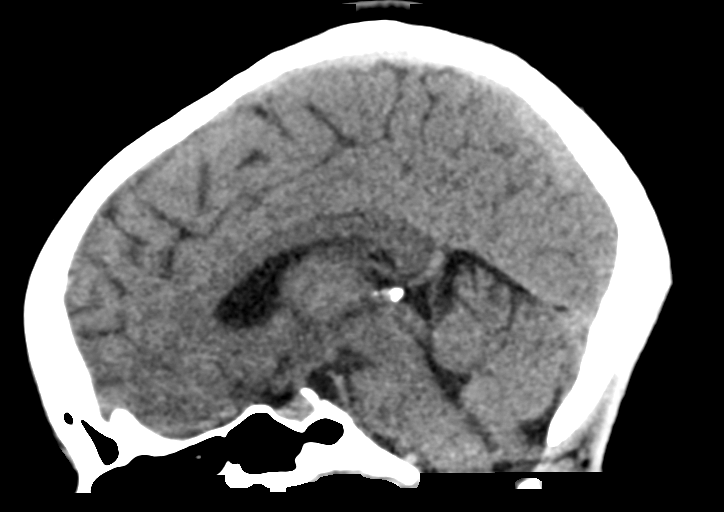
[im 34/51  brain]
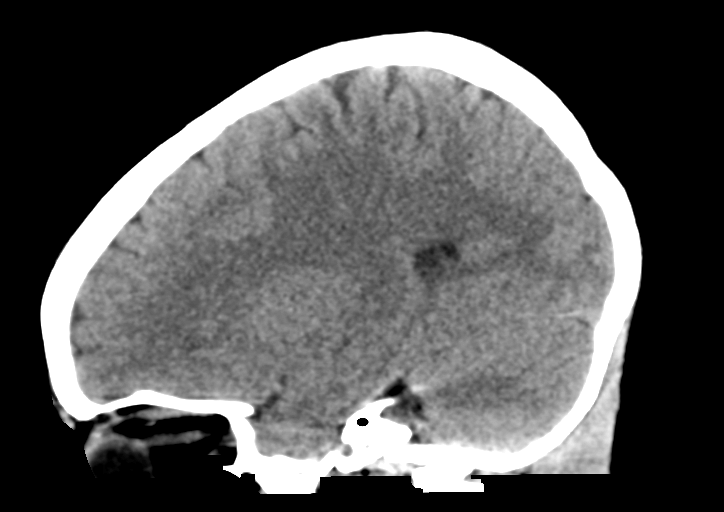

[16 of 47 positions shown; findings below may reference images not displayed]

FINDINGS: Brain: No evidence of acute infarction, hemorrhage, hydrocephalus,
extra-axial collection or mass lesion/mass effect.

Vascular: No hyperdense vessel or unexpected calcification.

Skull: Normal. Negative for fracture or focal lesion.

Sinuses/Orbits: No acute finding.

Other: None.
IMPRESSION: No acute intracranial abnormality. If there is continued clinical
concern for intracranial pathology, further evaluation with MRI
should be considered.

## 2022-12-06 ENCOUNTER — Encounter (HOSPITAL_COMMUNITY): Payer: Self-pay | Admitting: Emergency Medicine

## 2022-12-06 ENCOUNTER — Emergency Department (HOSPITAL_COMMUNITY): Payer: Medicaid Other

## 2022-12-06 ENCOUNTER — Emergency Department (HOSPITAL_COMMUNITY)
Admission: EM | Admit: 2022-12-06 | Discharge: 2022-12-06 | Disposition: A | Discharge status: Home / Self Care | Payer: Medicaid Other | Attending: Emergency Medicine | Admitting: Emergency Medicine

## 2022-12-06 ENCOUNTER — Other Ambulatory Visit: Payer: Self-pay

## 2022-12-06 DIAGNOSIS — Z91040 Latex allergy status: Secondary | ICD-10-CM | POA: Insufficient documentation

## 2022-12-06 DIAGNOSIS — R002 Palpitations: Secondary | ICD-10-CM | POA: Insufficient documentation

## 2022-12-06 DIAGNOSIS — R079 Chest pain, unspecified: Secondary | ICD-10-CM | POA: Diagnosis not present

## 2022-12-06 LAB — CBC
HCT: 37.8 % (ref 36.0–46.0)
Hemoglobin: 12.7 g/dL (ref 12.0–15.0)
MCH: 31 pg (ref 26.0–34.0)
MCHC: 33.6 g/dL (ref 30.0–36.0)
MCV: 92.2 fL (ref 80.0–100.0)
Platelets: 368 10*3/uL (ref 150–400)
RBC: 4.1 MIL/uL (ref 3.87–5.11)
RDW: 12.7 % (ref 11.5–15.5)
WBC: 8.8 10*3/uL (ref 4.0–10.5)
nRBC: 0 % (ref 0.0–0.2)

## 2022-12-06 LAB — BASIC METABOLIC PANEL
Anion gap: 11 (ref 5–15)
BUN: 8 mg/dL (ref 6–20)
CO2: 22 mmol/L (ref 22–32)
Calcium: 9.9 mg/dL (ref 8.9–10.3)
Chloride: 103 mmol/L (ref 98–111)
Creatinine, Ser: 0.75 mg/dL (ref 0.44–1.00)
GFR, Estimated: >60 mL/min (ref >60)
Glucose, Bld: 88 mg/dL (ref 70–99)
Potassium: 3.7 mmol/L (ref 3.5–5.1)
Sodium: 136 mmol/L (ref 135–145)

## 2022-12-06 LAB — TROPONIN I (HIGH SENSITIVITY): Troponin I (High Sensitivity): <2 ng/L (ref <18)

## 2022-12-06 LAB — I-STAT BETA HCG BLOOD, ED (MC, WL, AP ONLY): I-stat hCG, quantitative: <5 m[IU]/mL (ref <5)

## 2022-12-06 LAB — D-DIMER, QUANTITATIVE: D-Dimer, Quant: <0.27 ug/mL-FEU (ref 0.00–0.50)

## 2022-12-06 LAB — MAGNESIUM: Magnesium: 2.2 mg/dL (ref 1.7–2.4)

## 2022-12-06 NOTE — ED Provider Notes (Signed)
[facility] EMERGENCY DEPARTMENT AT [facility] Inpatient Provider Note   CSN: [id number] Arrival date & time: 12/06/22  0012     History  Chief Complaint  Patient presents with   Chest Pain    Alyssa Munoz is a 27 y.o. female.  The history is provided by the patient.  Patient with history of epilepsy on Lamictal, anxiety, previous history of Wolff-Parkinson-White s/p ablation in 2011 Presents with chest pain and palpitations.  Patient reports increased stress and anxiety over the past 2 weeks.  Over the past several days she has had intermittent episodes of palpitations with her heart racing fast but no syncope.  She is also had intermittent episodes of chest pressure and sharp chest pain.  No pleuritic pain.  She currently vapes but is trying to cut back.  Denies any cocaine use. She currently takes oral contraceptives Denies any excessive caffeine use. Patient called EMS earlier in the week but felt improved and did not get seen. Tonight she was brought in by EMS was given aspirin. Patient reports no recent seizures   Past Medical History:  Diagnosis Date   Anemia    Anxiety    Chronic chest wall pain 2013   Depression    History of Wolff-Parkinson-White (WPW) syndrome    s/p ablation 2011 [facility] (Peds Cards Dr. Cristy Folks)   Panic attack     Home Medications Prior to Admission medications   Medication Sig Start Date End Date Taking? Authorizing Provider  AUROVELA 24 FE 1-20 MG-MCG(24) tablet Take 1 tablet by mouth at bedtime. 10/22/20   [provider]  levETIRAcetam (KEPPRA) 500 MG tablet Take 1 tablet (500 mg total) by mouth 2 (two) times daily. 11/10/20 12/10/20  Linwood Dibbles, MD  MELATONIN PO Take 2 tablets by mouth at bedtime as needed (sleep).    [provider]      Allergies    Latex    Review of Systems   Review of Systems  Constitutional:  Negative for fever.  Cardiovascular:  Positive for chest pain and palpitations. Negative for leg  swelling.  Neurological:  Negative for syncope.    Physical Exam Updated Vital Signs BP 132/83 (BP Location: Right Arm)   Pulse (!) 58   Temp 98.4 F (36.9 C) (Oral)   Resp 18   Wt 59 kg   SpO2 100%   BMI 23.79 kg/m  Physical Exam CONSTITUTIONAL: Well developed/well nourished HEAD: Normocephalic/atraumatic EYES: EOMI/PERRL ENMT: Mucous membranes moist NECK: supple no meningeal signs SPINE/BACK:entire spine nontender CV: S1/S2 noted, no murmurs/rubs/gallops noted LUNGS: Lungs are clear to auscultation bilaterally, no apparent distress ABDOMEN: soft, nontender NEURO: Pt is awake/alert/appropriate, moves all extremitiesx4.  No facial droop.   EXTREMITIES: pulses normal/equalx4, full ROM SKIN: warm, color normal PSYCH: no abnormalities of mood noted, alert and oriented to situation  ED Results / Procedures / Treatments   Labs (all labs ordered are listed, but only abnormal results are displayed) Labs Reviewed  BASIC METABOLIC PANEL  CBC  D-DIMER, QUANTITATIVE  MAGNESIUM  I-STAT BETA HCG BLOOD, ED ([facility], [facility], [facility] ONLY)  TROPONIN I (HIGH SENSITIVITY)    EKG EKG Interpretation  Date/Time:  Sunday Dec 06 2022 00:23:12 EDT Ventricular Rate:  60 PR Interval:  138 QRS Duration: 78 QT Interval:  364 QTC Calculation: 364 R Axis:   50 Text Interpretation: Normal sinus rhythm with sinus arrhythmia Normal ECG No significant change since last tracing Confirmed by Zadie Rhine ([id number]) on 12/06/2022 12:54:38 AM  Radiology  DG Chest 2 View  Result Date: 12/06/2022 CLINICAL DATA:  Central chest pain x2 days. EXAM: CHEST - 2 VIEW COMPARISON:  August 15, 2014 FINDINGS: The heart size and mediastinal contours are within normal limits. The lungs are hyperinflated. Both lungs are clear. The visualized skeletal structures are unremarkable. IMPRESSION: No active cardiopulmonary disease. Electronically Signed   By: Aram Candela M.D.   On: 12/06/2022 01:27    Procedures Procedures     Medications Ordered in ED Medications - No data to display  ED Course/ Medical Decision Making/ A&P Clinical Course as of 12/06/22 0343  Sun Dec 06, 2022  0117 Patient with distant history of WPW s/p ablation presents with palpitations and chest pain. Patient currently stable, sinus rhythm on monitor.  Imaging and labs are pending at this time [DW]  0343 Patient feeling improved.  No arrhythmias noted on monitoring.  Workup overall reassuring.  She will be given referral to cardiology.  [DW]    Clinical Course User Index [DW] Zadie Rhine, MD                             Medical Decision Making Amount and/or Complexity of Data Reviewed Labs: ordered. Radiology: ordered.   This patient presents to the ED for concern of palpitations, this involves an extensive number of treatment options, and is a complaint that carries with it a high risk of complications and morbidity.  The differential diagnosis includes but is not limited to sinus tachycardia, atrial flutter, atrial fibrillation, ventricular tachycardia, Wolff-Parkinson-White, SVT  Comorbidities that complicate the patient evaluation: Patient's presentation is complicated by their history of WPW, epilepsy  Social Determinants of Health: Patient's  vaping use   increases the complexity of managing their presentation  Additional history obtained: Records reviewed Care Everywhere/External Records  Lab Tests: I Ordered, and personally interpreted labs.  The pertinent results include: Labs unremarkable  Imaging Studies ordered: I ordered imaging studies including X-ray chest pain   I independently visualized and interpreted imaging which showed Labs overall unremarkable I agree with the radiologist interpretation  Cardiac Monitoring: The patient was maintained on a cardiac monitor.  I personally viewed and interpreted the cardiac monitor which showed an underlying rhythm of:  sinus rhythm   Test Considered: Patient  is low risk / negative by Wells criteria, therefore do not feel that CT chest is indicated.  Reevaluation: After the interventions noted above, I reevaluated the patient and found that they have :improved  Complexity of problems addressed: Patient's presentation is most consistent with  acute presentation with potential threat to life or bodily function  Disposition: After consideration of the diagnostic results and the patient's response to treatment,  I feel that the patent would benefit from discharge   .           Final Clinical Impression(s) / ED Diagnoses Final diagnoses:  Palpitations    Rx / DC Orders ED Discharge Orders          Ordered    Ambulatory referral to Cardiology        12/06/22 0112              Zadie Rhine, MD 12/06/22 [phone]

## 2022-12-06 NOTE — ED Triage Notes (Signed)
Pt in with central chest pain x 2 days, pt states it is worse when she is stressed. Pt is c/o palpitations and sob, EMS reports sinus arrythmia during transport. Given 324mg  ASA en route. Pt also states hx of WPW syndrome, had ablation done in her teens, and hx of epilepsy  VS en route: 128/90 60HR 18RR 98%RA

## 2022-12-12 ENCOUNTER — Ambulatory Visit
Admission: EM | Admit: 2022-12-12 | Discharge: 2022-12-12 | Disposition: A | Discharge status: Home / Self Care | Payer: Medicaid Other

## 2022-12-12 DIAGNOSIS — R21 Rash and other nonspecific skin eruption: Secondary | ICD-10-CM | POA: Diagnosis not present

## 2022-12-12 DIAGNOSIS — L270 Generalized skin eruption due to drugs and medicaments taken internally: Secondary | ICD-10-CM

## 2022-12-12 DIAGNOSIS — G40909 Epilepsy, unspecified, not intractable, without status epilepticus: Secondary | ICD-10-CM | POA: Diagnosis not present

## 2022-12-12 MED ORDER — PREDNISONE 20 MG PO TABS: 20.0000 mg | ORAL_TABLET | Freq: Every day | ORAL | 0 refills | Status: AC

## 2022-12-12 MED ORDER — HYDROXYZINE HCL 25 MG PO TABS: 12.5000 mg | ORAL_TABLET | Freq: Three times a day (TID) | ORAL | 0 refills | Status: AC | PRN

## 2022-12-12 NOTE — ED Triage Notes (Signed)
Patient here today due to allergic reaction (hives) on arms and face X 2 days. She has been taken Aspirin for heart palpations X 3-4 days and was in the hospital on 12/06/2022 for heart palpitations and has allergy to Latex. Not sure if she came in contact with latex while in the hospital. She takes Lamotrigine and Gabapentin for epilepsy.

## 2022-12-12 NOTE — ED Provider Notes (Signed)
[facility] - URGENT CARE CENTER  Note:  This document was prepared using Conservation officer, historic buildings and may include unintentional dictation errors.  MRN: [id number] DOB: 1996-04-23  Subjective:   Alyssa Munoz is a 27 y.o. female presenting for 2 day history of persistent itchy rash over upper body including the face, started noticing some swelling of the eyelid.  In the past 4 days she did start taking Bayer aspirin.  This was an effort to help with chest pains, chest tightness and heart racing, palpitations.  Was seen through the emergency room 12/06/2022.  Had extensive workup.  This was largely on remarkable including a chest x-ray, D-dimer test.  [facility] subsequently [facility] ago on 12/09/2022 for the rash that she is currently having.  She underwent to steroid injection with IM dexamethasone at 10 mg.  It did help some but she continues to have these lesions.  Has a seizure disorder, takes Lamictal.  Last seizure was ~2 years ago. Has been taking Lamictal for ~1 year.  She is also taking gabapentin.  Has not followed up with her neurologist in some time as she has been doing well with respect to her seizures.  No throat closing sensations, oral swelling.  No current facility-administered medications for this encounter.  Current Outpatient Medications:    AUROVELA 24 FE 1-20 MG-MCG(24) tablet, Take 1 tablet by mouth at bedtime., Disp: , Rfl:    gabapentin (NEURONTIN) 300 MG capsule, Take 300 mg by mouth at bedtime., Disp: , Rfl:    lamoTRIgine (LAMICTAL) 200 MG tablet, Take 200 mg by mouth daily., Disp: , Rfl:    levETIRAcetam (KEPPRA) 500 MG tablet, Take 1 tablet (500 mg total) by mouth 2 (two) times daily., Disp: 60 tablet, Rfl: 0   MELATONIN PO, Take 2 tablets by mouth at bedtime as needed (sleep)., Disp: , Rfl:    Allergies  Allergen Reactions   Latex Hives and Itching    Past Medical History:  Diagnosis Date   Anemia    Anxiety    Chronic chest wall  pain 2013   Depression    History of Wolff-Parkinson-White (WPW) syndrome    s/p ablation 2011 [facility] (Peds Cards Dr. Cristy Folks)   Panic attack      Past Surgical History:  Procedure Laterality Date   CARDIAC ELECTROPHYSIOLOGY STUDY AND ABLATION     wolffe parkinson syndrome    Family History  Problem Relation Age of Onset   Diabetes Maternal Grandmother    Lung cancer Maternal Grandfather    Sudden Cardiac Death Neg Hx     Social History   Tobacco Use   Smoking status: Former    Packs/day: .25    Types: Cigarettes    Quit date: 08/2017    Years since quitting: 5.3   Smokeless tobacco: Never  Vaping Use   Vaping Use: Never used  Substance Use Topics   Alcohol use: No   Drug use: Not Currently    Types: Marijuana    ROS   Objective:   Vitals: BP 116/85 (BP Location: Right Arm)   Pulse 80   Temp 99.1 F (37.3 C) (Oral)   Resp 16   Ht 5\' 3"  (1.6 m)   Wt 124 lb (56.2 kg)   SpO2 96%   BMI 21.97 kg/m   Physical Exam Constitutional:      General: She is not in acute distress.    Appearance: Normal appearance. She is well-developed. She is  not ill-appearing, toxic-appearing or diaphoretic.  HENT:     Head: Normocephalic and atraumatic.     Right Ear: External ear normal.     Left Ear: External ear normal.     Nose: Nose normal.     Mouth/Throat:     Mouth: Mucous membranes are moist.     Pharynx: No oropharyngeal exudate or posterior oropharyngeal erythema.     Comments: Patient is controlling secretions and speaking in full sentences. Eyes:     General: Lids are everted, no foreign bodies appreciated. Vision grossly intact. No scleral icterus.       Right eye: No foreign body, discharge or hordeolum.        Left eye: No foreign body, discharge or hordeolum.     Extraocular Movements: Extraocular movements intact.     Right eye: Normal extraocular motion.     Left eye: Normal extraocular motion and no nystagmus.     Conjunctiva/sclera:     Right  eye: Right conjunctiva is not injected. No chemosis, exudate or hemorrhage.    Left eye: Left conjunctiva is not injected. No chemosis, exudate or hemorrhage.  Cardiovascular:     Rate and Rhythm: Normal rate and regular rhythm.     Heart sounds: Normal heart sounds. No murmur heard.    No friction rub. No gallop.  Pulmonary:     Effort: Pulmonary effort is normal. No respiratory distress.     Breath sounds: No stridor. No wheezing, rhonchi or rales.  Chest:     Chest wall: No tenderness.  Skin:    General: Skin is warm and dry.     Findings: Rash (diffuse papular lesions over arms, face) present.  Neurological:     General: No focal deficit present.     Mental Status: She is alert and oriented to person, place, and time.  Psychiatric:        Mood and Affect: Mood normal.        Behavior: Behavior normal.     Assessment and Plan :   PDMP not reviewed this encounter.  1. Drug rash   2. Rash and nonspecific skin eruption   3. Seizure disorder [facility])     Extensive review performed of her visits, medical record and results from the emergency room, [facility].  Was largely unremarkable.  Discussed possibility of Lamictal versus Bayer aspirin causing her reaction.  She is to stop the Bayer aspirin.  Follow-up with her neurologist urgently.  Recommended a lower dose of prednisone so as to avoid risking a seizure.  Will use 20 mg of prednisone for 5 days.  At this time, she does not have signs of Stevens-Johnson syndrome, anaphylaxis.  Recommended maintaining strict ER precautions.  Use hydroxyzine for supplemental care.  Counseled patient on potential for adverse effects with medications prescribed/recommended today, ER and return-to-clinic precautions discussed, patient verbalized understanding.    Wallis Bamberg, [place] 12/12/22 1553

## 2024-03-05 ENCOUNTER — Encounter (HOSPITAL_BASED_OUTPATIENT_CLINIC_OR_DEPARTMENT_OTHER): Payer: Self-pay

## 2024-03-05 ENCOUNTER — Other Ambulatory Visit: Payer: Self-pay

## 2024-03-05 DIAGNOSIS — S93402A Sprain of unspecified ligament of left ankle, initial encounter: Secondary | ICD-10-CM | POA: Insufficient documentation

## 2024-03-05 DIAGNOSIS — S99912A Unspecified injury of left ankle, initial encounter: Secondary | ICD-10-CM | POA: Diagnosis present

## 2024-03-05 DIAGNOSIS — G20C Parkinsonism, unspecified: Secondary | ICD-10-CM | POA: Insufficient documentation

## 2024-03-05 DIAGNOSIS — X501XXA Overexertion from prolonged static or awkward postures, initial encounter: Secondary | ICD-10-CM | POA: Diagnosis not present

## 2024-03-05 DIAGNOSIS — Z91040 Latex allergy status: Secondary | ICD-10-CM | POA: Insufficient documentation

## 2024-03-05 NOTE — ED Triage Notes (Addendum)
 Patient fell off one step. Twisted L ankle and felt a pop. Now has pain radiating up her leg. Mild swelling to ankle area. Denies injury elsewhere from the fall.

## 2024-03-06 ENCOUNTER — Emergency Department (HOSPITAL_BASED_OUTPATIENT_CLINIC_OR_DEPARTMENT_OTHER)

## 2024-03-06 ENCOUNTER — Emergency Department (HOSPITAL_BASED_OUTPATIENT_CLINIC_OR_DEPARTMENT_OTHER)
Admission: EM | Admit: 2024-03-06 | Discharge: 2024-03-06 | Disposition: A | Discharge status: Home / Self Care | Attending: Emergency Medicine | Admitting: Emergency Medicine

## 2024-03-06 DIAGNOSIS — S93402A Sprain of unspecified ligament of left ankle, initial encounter: Secondary | ICD-10-CM

## 2024-03-06 MED ORDER — IBUPROFEN 600 MG PO TABS: 600.0000 mg | ORAL_TABLET | Freq: Four times a day (QID) | ORAL | 0 refills | Status: AC | PRN

## 2024-03-06 MED ORDER — KETOROLAC TROMETHAMINE 30 MG/ML IJ SOLN
30.0000 mg | Freq: Once | INTRAMUSCULAR | Status: AC
  Administered 2024-03-06: 30 mg via INTRAMUSCULAR
  Filled 2024-03-06: qty 1

## 2024-03-06 NOTE — Discharge Instructions (Signed)
 You were seen today for an ankle injury.  Your x-rays do not show any evidence of broken bones.  You likely have a sprain.  Ice and elevate.  Take ibuprofen  for pain.

## 2024-03-06 NOTE — ED Provider Notes (Signed)
 [place] EMERGENCY DEPARTMENT AT [facility] Provider Note   CSN: [id number] Arrival date & time: 03/05/24  2332     Patient presents with: Leg Injury   Alyssa Munoz is a 28 y.o. female.   HPI     This is a 28 year old female who presents with left ankle pain and swelling.  Patient reports around 6 PM she twisted her left ankle stepping down off a step.  She felt a pop.  She states that the pain radiates into her left knee.  Did not hit her head or lose consciousness.  Has not taken anything for her pain.  Prior to Admission medications   Medication Sig Start Date End Date Taking? Authorizing Provider  ibuprofen  (ADVIL ) 600 MG tablet Take 1 tablet (600 mg total) by mouth every 6 (six) hours as needed. 03/06/24  Yes Jeyda Siebel, Charmaine FALCON, MD  AUROVELA 24 FE 1-20 MG-MCG(24) tablet Take 1 tablet by mouth at bedtime. 10/22/20   [provider]  gabapentin (NEURONTIN) 300 MG capsule Take 300 mg by mouth at bedtime.    [provider]  hydrOXYzine  (ATARAX ) 25 MG tablet Take 0.5-1 tablets (12.5-25 mg total) by mouth every 8 (eight) hours as needed for itching. 12/12/22   Christopher Savannah, PA-C  lamoTRIgine (LAMICTAL) 200 MG tablet Take 200 mg by mouth daily.    [provider]  levETIRAcetam  (KEPPRA ) 500 MG tablet Take 1 tablet (500 mg total) by mouth 2 (two) times daily. 11/10/20 12/10/20  Randol Simmonds, MD  MELATONIN PO Take 2 tablets by mouth at bedtime as needed (sleep).    [provider]  predniSONE  (DELTASONE ) 20 MG tablet Take 1 tablet (20 mg total) by mouth daily with breakfast. 12/12/22   Christopher Savannah, PA-C    Allergies: Latex    Review of Systems  Musculoskeletal:        Ankle pain and swelling  All other systems reviewed and are negative.   Updated Vital Signs There were no vitals taken for this visit.  Physical Exam Vitals and nursing note reviewed.  Constitutional:      Appearance: She is well-developed. She is not ill-appearing.   HENT:     Head: Normocephalic and atraumatic.  Eyes:     Pupils: Pupils are equal, round, and reactive to light.  Cardiovascular:     Rate and Rhythm: Normal rate and regular rhythm.  Pulmonary:     Effort: Pulmonary effort is normal. No respiratory distress.  Musculoskeletal:     Cervical back: Neck supple.     Comments: Tenderness to palpation over the left lateral malleolus with slight swelling noted, decreased range of motion secondary to pain, 2+ DP pulse, no proximal fibular tenderness, normal range of motion of the left knee  Skin:    General: Skin is warm and dry.  Neurological:     Mental Status: She is alert and oriented to person, place, and time.  Psychiatric:        Mood and Affect: Mood normal.     (all labs ordered are listed, but only abnormal results are displayed) Labs Reviewed - No data to display  EKG: None  Radiology: DG Ankle Left Port Result Date: 03/06/2024 CLINICAL DATA:  Patient fell off one-step. Twisted left ankle and felt a pop. Pain radiating in the leg. EXAM: PORTABLE LEFT ANKLE - 2 VIEW COMPARISON:  None Available. FINDINGS: Soft tissue swelling over the lateral malleolus. No acute fracture or dislocation. Ankle joint effusion. IMPRESSION: Soft tissue swelling  over the lateral malleolus. No acute fracture or dislocation. Electronically Signed   By: Norman Gatlin M.D.   On: 03/06/2024 00:26     Procedures   Medications Ordered in the ED  ketorolac  (TORADOL ) 30 MG/ML injection 30 mg (30 mg Intramuscular Given 03/06/24 0026)                                    Medical Decision Making Amount and/or Complexity of Data Reviewed Radiology: ordered.  Risk Prescription drug management.   This patient presents to the ED for concern of ankle pain, this involves an extensive number of treatment options, and is a complaint that carries with it a high risk of complications and morbidity.  I considered the following differential and admission for  this acute, potentially life threatening condition.  The differential diagnosis includes sprain, fracture  MDM:    This is a 28 year old female who presents with left ankle pain.  She is nontoxic and vital signs are reassuring.  Pain over the lateral malleolus.  X-rays independently reviewed by myself.  No evidence of fracture.  Will place in a brace for presumed sprain.  Recommend rest, ice, compression, elevation.  NSAIDs for pain.  (Labs, imaging, consults)  Labs: I Ordered, and personally interpreted labs.  The pertinent results include: None  Imaging Studies ordered: I ordered imaging studies including ankle x-ray I independently visualized and interpreted imaging. I agree with the radiologist interpretation  Additional history obtained from chart review.  External records from outside source obtained and reviewed including prior evaluations  Cardiac Monitoring: The patient was not maintained on a cardiac monitor.  If on the cardiac monitor, I personally viewed and interpreted the cardiac monitored which showed an underlying rhythm of: N/A  Reevaluation: After the interventions noted above, I reevaluated the patient and found that they have :stayed the same  Social Determinants of Health:  lives independently  Disposition: Discharge  Co morbidities that complicate the patient evaluation  Past Medical History:  Diagnosis Date   Anemia    Anxiety    Chronic chest wall pain 2013   Depression    History of Wolff-Parkinson-White (WPW) syndrome    s/p ablation 2011 [facility] (Peds Cards Dr. Cordella Servant)   Panic attack      Medicines Meds ordered this encounter  Medications   ketorolac  (TORADOL ) 30 MG/ML injection 30 mg   ibuprofen  (ADVIL ) 600 MG tablet    Sig: Take 1 tablet (600 mg total) by mouth every 6 (six) hours as needed.    Dispense:  30 tablet    Refill:  0    I have reviewed the patients home medicines and have made adjustments as needed  Problem List / ED  Course: Problem List Items Addressed This Visit   None Visit Diagnoses       Sprain of left ankle, unspecified ligament, initial encounter    -  Primary                Final diagnoses:  Sprain of left ankle, unspecified ligament, initial encounter    ED Discharge Orders          Ordered    ibuprofen  (ADVIL ) 600 MG tablet  Every 6 hours PRN        03/06/24 0030               Bari Charmaine FALCON, MD 03/06/24 [phone]
